# Patient Record
Sex: Female | Born: 1940 | Race: White | Hispanic: No | Smoking: Never smoker
Health system: Southern US, Community
[De-identification: ages and names within clinical notes are randomized; demographics above are authoritative.]

## PROBLEM LIST (undated history)

## (undated) DIAGNOSIS — M810 Age-related osteoporosis without current pathological fracture: Secondary | ICD-10-CM

## (undated) DIAGNOSIS — N200 Calculus of kidney: Secondary | ICD-10-CM

## (undated) DIAGNOSIS — K5790 Diverticulosis of intestine, part unspecified, without perforation or abscess without bleeding: Secondary | ICD-10-CM

## (undated) DIAGNOSIS — K589 Irritable bowel syndrome without diarrhea: Secondary | ICD-10-CM

## (undated) DIAGNOSIS — I10 Essential (primary) hypertension: Secondary | ICD-10-CM

## (undated) DIAGNOSIS — E7800 Pure hypercholesterolemia, unspecified: Secondary | ICD-10-CM

## (undated) HISTORY — DX: Essential (primary) hypertension: I10

## (undated) HISTORY — PX: KIDNEY STONE SURGERY: SHX686

## (undated) HISTORY — PX: LAPAROSCOPIC TUBAL LIGATION: SUR803

## (undated) HISTORY — PX: BREAST BIOPSY: SHX20

---

## 1980-07-17 HISTORY — PX: CHOLECYSTECTOMY: SHX55

## 1998-03-01 ENCOUNTER — Other Ambulatory Visit: Admission: RE | Admit: 1998-03-01 | Discharge: 1998-03-01 | Payer: Self-pay | Admitting: Obstetrics & Gynecology

## 2000-04-19 ENCOUNTER — Encounter: Payer: Self-pay | Admitting: Internal Medicine

## 2000-04-19 ENCOUNTER — Encounter: Admission: RE | Admit: 2000-04-19 | Discharge: 2000-04-19 | Payer: Self-pay | Admitting: Internal Medicine

## 2000-06-19 ENCOUNTER — Encounter (INDEPENDENT_AMBULATORY_CARE_PROVIDER_SITE_OTHER): Payer: Self-pay | Admitting: *Deleted

## 2000-06-19 ENCOUNTER — Ambulatory Visit (HOSPITAL_COMMUNITY): Admission: RE | Admit: 2000-06-19 | Discharge: 2000-06-19 | Payer: Self-pay | Admitting: *Deleted

## 2001-05-24 ENCOUNTER — Encounter: Admission: RE | Admit: 2001-05-24 | Discharge: 2001-05-24 | Payer: Self-pay | Admitting: Internal Medicine

## 2001-05-24 ENCOUNTER — Encounter: Payer: Self-pay | Admitting: Internal Medicine

## 2002-05-30 ENCOUNTER — Encounter: Admission: RE | Admit: 2002-05-30 | Discharge: 2002-05-30 | Payer: Self-pay | Admitting: Internal Medicine

## 2002-05-30 ENCOUNTER — Encounter: Payer: Self-pay | Admitting: Internal Medicine

## 2003-06-03 ENCOUNTER — Encounter: Admission: RE | Admit: 2003-06-03 | Discharge: 2003-06-03 | Payer: Self-pay | Admitting: Internal Medicine

## 2004-06-10 ENCOUNTER — Ambulatory Visit (HOSPITAL_COMMUNITY): Admission: RE | Admit: 2004-06-10 | Discharge: 2004-06-10 | Payer: Self-pay | Admitting: Internal Medicine

## 2004-06-13 ENCOUNTER — Other Ambulatory Visit: Admission: RE | Admit: 2004-06-13 | Discharge: 2004-06-13 | Payer: Self-pay | Admitting: Family

## 2005-06-15 ENCOUNTER — Ambulatory Visit (HOSPITAL_COMMUNITY): Admission: RE | Admit: 2005-06-15 | Discharge: 2005-06-15 | Payer: Self-pay | Admitting: Internal Medicine

## 2005-08-31 ENCOUNTER — Encounter: Admission: RE | Admit: 2005-08-31 | Discharge: 2005-08-31 | Payer: Self-pay | Admitting: Internal Medicine

## 2006-06-22 ENCOUNTER — Other Ambulatory Visit: Admission: RE | Admit: 2006-06-22 | Discharge: 2006-06-22 | Payer: Self-pay | Admitting: Internal Medicine

## 2006-06-22 ENCOUNTER — Ambulatory Visit (HOSPITAL_COMMUNITY): Admission: RE | Admit: 2006-06-22 | Discharge: 2006-06-22 | Payer: Self-pay | Admitting: Internal Medicine

## 2006-07-27 ENCOUNTER — Inpatient Hospital Stay (HOSPITAL_COMMUNITY): Admission: AD | Admit: 2006-07-27 | Discharge: 2006-07-31 | Payer: Self-pay | Admitting: Internal Medicine

## 2006-07-27 ENCOUNTER — Encounter: Admission: RE | Admit: 2006-07-27 | Discharge: 2006-07-27 | Payer: Self-pay | Admitting: Internal Medicine

## 2006-09-10 ENCOUNTER — Encounter: Admission: RE | Admit: 2006-09-10 | Discharge: 2006-09-10 | Payer: Self-pay | Admitting: Gastroenterology

## 2007-08-14 ENCOUNTER — Encounter: Admission: RE | Admit: 2007-08-14 | Discharge: 2007-08-14 | Payer: Self-pay | Admitting: Internal Medicine

## 2008-01-06 ENCOUNTER — Ambulatory Visit (HOSPITAL_COMMUNITY): Admission: RE | Admit: 2008-01-06 | Discharge: 2008-01-06 | Payer: Self-pay | Admitting: Internal Medicine

## 2009-01-08 ENCOUNTER — Ambulatory Visit (HOSPITAL_COMMUNITY): Admission: RE | Admit: 2009-01-08 | Discharge: 2009-01-08 | Payer: Self-pay | Admitting: Family Medicine

## 2010-03-01 ENCOUNTER — Ambulatory Visit (HOSPITAL_COMMUNITY): Admission: RE | Admit: 2010-03-01 | Discharge: 2010-03-01 | Payer: Self-pay | Admitting: Internal Medicine

## 2010-08-07 ENCOUNTER — Encounter: Payer: Self-pay | Admitting: Internal Medicine

## 2010-12-02 NOTE — Discharge Summary (Signed)
[name], [name] NO.:  [id number]   MEDICAL RECORD NO.:  [id number]          PATIENT TYPE:  INP   LOCATION:  6707                         FACILITY:  [facility]   PHYSICIAN:  Thora Lance, M.D.  DATE OF BIRTH:  1940-09-21   DATE OF ADMISSION:  07/27/2006  DATE OF DISCHARGE:  07/31/2006                               DISCHARGE SUMMARY   REASON FOR ADMISSION:  This is a middle-aged female who presented to the  office with the complaint of abdominal pain, fever, body aches, and  constipation.  She had been seen in our office January 9 and diagnosed  with diverticulitis and given Septra and asked to follow up.  Despite  treatment, the patient's symptoms have worsened.  She was seen in our  office and had a white count of 21,000 with an outpatient CT the day of  admission showing diverticulitis of the distal descending and proximal  sigmoid colon with a small amount of free air indicating micro  perforation, no abscess.   SIGNIFICANT FINDINGS:  VITAL SIGNS:  Temperature 99.3.  LUNGS:  Clear.  HEART:  Regular rate and rhythm.  ABDOMEN:  Soft, positive guarding, fullness in the left lower quadrant,  no rebound tenderness.   LABORATORIES:  CBC:  WBC 14.7, hemoglobin 10.9, platelet count 291.  Chemistries:  Sodium 133, potassium 4.0, chloride 101, BUN 8, creatinine  1.0, bicarbonate 26, sugar 95.   HOSPITAL COURSE:  The patient was admitted for acute diverticulitis.  She was placed on Cipro and Flagyl.  Her abdominal pain improved over  the weekend and her white count normalized.  She did develop persistent  nausea over the weekend.  A repeat CAT scan was done that showed no  significant change.  Her Flagyl and Cipro were switched to Zosyn and her  nausea resolved within 24 hours.  She was initially treated with clear  liquids and was switched to a low-residue diet which she tolerated well.  She was discharged in good condition.  It is noted that her iron was low  with an iron level of 10 and a percent saturation of 5% during this  admission.  She will likely need an outpatient colonoscopy and probable  referral to General Surgery thereafter as she has had a history of 3-4  episodes of diverticulitis in the last 10 months.   DISCHARGE DIAGNOSES:  1. Acute diverticulitis.  2. Nausea.  3. Iron-deficiency anemia.  4. Hypertension.  5. Constipation.   DISCHARGE MEDICATIONS:  1. Lisinopril 20 mg a day.  2. Evista 60 mg a day.  3. MiraLax 17 g daily.  4. Multivitamin daily.  5. Augmentin 875 mg p.o. b.i.d., 9 days.  6. Calcium held at discharge.   FOLLOWUP:  In 2 weeks, Dr. Valentina Lucks.  Will arrange GI and surgical  consultations at that time.   DISCHARGE DIET:  Low residue.   ACTIVITY:  As tolerated.  Return to work August 02, 2006.           ______________________________  Thora Lance, M.D.     JJG/MEDQ  D:  08/01/2006  T:  08/02/2006  Job:  [id number]

## 2010-12-02 NOTE — H&P (Signed)
Meghan Hall, Meghan Hall                  ACCOUNT NO.:  [id number]   MEDICAL RECORD NO.:  [id number]          PATIENT TYPE:  INP   LOCATION:  NA                           FACILITY:  [facility]   PHYSICIAN:  Deirdre Peer. Polite, M.D. DATE OF BIRTH:  [birth year]   DATE OF ADMISSION:  DATE OF DISCHARGE:                              HISTORY & PHYSICAL   The patient is being directly admitted via outpatient office.   REASON FOR ADMISSION:  Diverticulitis, failed outpatient treatment.   HISTORY OF PRESENT ILLNESS:  Meghan Hall presents to the office with  complaints of abdominal pain, fever, body aches all over.  The patient  also complains of constipation.  In fact, has not had a bowel movement  since Sunday.  The patient was previously evaluated in the office on  July 25, 2006, by nurse practitioner for exacerbation of  diverticulitis.  At that time, the patient had a temp of 99.4, exam was  consistent with diverticulitis.  The patient was given Septra and asked  to follow up.  Despite the above treatment __________  , this patient  symptoms did not abate, in fact worsened.  The patient presents to the  office today as stated with progressive symptoms of abdominal  discomfort, aching all over, and fever.  In the office today her  temperature is 99.3, BP 100/60.  Labs were ordered outpatient.  The  patient had a white count of 21,000, 85% neutrophils, hemoglobin 12,  hematocrit 38, platelets 329.  The patient had an outpatient CT as well.  CT showed diverticulitis of the distal descending and proximal sigmoid  colon with a small amount of free air indicating micro perforation.  No  abscess.  Because of these findings, high white count, abnormal CT, and  failing outpatient treatment, admission is deemed necessary for further  evaluation and treatment of diverticulitis.   PAST MEDICAL HISTORY:  1. The patient carries a history of hypertension.  2. History of diverticulosis.  3. History of IBS.  4.  History of hyperlipidemia.  5. History of osteoporosis.   CURRENT MEDICATIONS:  1. Lisinopril 20 mg daily.  2. Evista 60 mg a day.  3. Caltrate 600 mg plus D every day.  4. MiraLax 17 grams every day.  5. Multivitamin daily.  6. Dulcolax p.r.n.   SOCIAL HISTORY:  The patient married, has two sons, she is a  Scientist, physiological for a trucking company.  Negative for tobacco.  No alcohol.  No drugs.   ALLERGIES:  NO KNOWN DRUG ALLERGIES.   PAST SURGICAL HISTORY:  Significant for:  1. Cholecystectomy in 1982.  2. Bilateral tubal ligation in the past.  3. Breast biopsy x2 in 1985 and 1995, by Dr. Debbrah Alar,      results negative.  4. Kidney stone removal in June 2000.   REVIEW OF SYSTEMS:  As stated in the HPI.   FAMILY HISTORY:  Father died at age 68 of an MI.  Mother died at 31  breast cancer, hypertension, diabetes, and kidney disease where the  other medical problems she had.  Brother  with hypertension, status post  CABG at age 61.  Sister with hypertension.   PHYSICAL EXAMINATION:  GENERAL:  The patient is alert in mild to  moderate distress secondary to abdominal discomfort.  The patient has  occasional bouts of chills.  VITAL SIGNS:  As stated in the HPI.  HEENT:  Within normal limits.  CHEST:  Clear to auscultation bilaterally.  CARDIOVASCULAR:  Regular.  ABDOMEN:  Soft.  Positive guarding.  The patient does have the sensation  of fullness in the left lower quadrant.  There is no rebound tenderness.  No tympani.  EXTREMITIES:  No clubbing, cyanosis, or edema.  NEUROLOGIC:  Essentially nonfocal.   ASSESSMENT:  1. Diverticulitis which has failed outpatient treatment.  Recommend      admission to the hospital for intravenous antibiotics, intravenous      fluid, and analgesia.  2. Hypertension.  Currently relative hypotension.  We will hold blood      pressure medications currently.  3. Hyperlipidemia.  Resume outpatient medicines when stable.  4. History of  irritable bowel syndrome.  5. Osteoporosis.   We will make further recommendations when deemed necessary.  Thank you  in advance.      Deirdre Peer. Polite, M.D.  Electronically Signed     RDP/MEDQ  D:  07/27/2006  T:  07/27/2006  Job:  [id number]

## 2011-01-31 ENCOUNTER — Other Ambulatory Visit (HOSPITAL_COMMUNITY): Payer: Self-pay | Admitting: Internal Medicine

## 2011-01-31 DIAGNOSIS — Z1231 Encounter for screening mammogram for malignant neoplasm of breast: Secondary | ICD-10-CM

## 2011-03-03 ENCOUNTER — Ambulatory Visit (HOSPITAL_COMMUNITY)
Admission: RE | Admit: 2011-03-03 | Discharge: 2011-03-03 | Disposition: A | Discharge status: Home / Self Care | Payer: Medicare Other | Source: Ambulatory Visit | Attending: Internal Medicine | Admitting: Internal Medicine

## 2011-03-03 DIAGNOSIS — Z1231 Encounter for screening mammogram for malignant neoplasm of breast: Secondary | ICD-10-CM

## 2011-12-19 ENCOUNTER — Other Ambulatory Visit: Payer: Self-pay | Admitting: Internal Medicine

## 2011-12-19 DIAGNOSIS — R1013 Epigastric pain: Secondary | ICD-10-CM

## 2011-12-21 ENCOUNTER — Ambulatory Visit
Admission: RE | Admit: 2011-12-21 | Discharge: 2011-12-21 | Disposition: A | Discharge status: Home / Self Care | Payer: Medicare Other | Source: Ambulatory Visit | Attending: Internal Medicine | Admitting: Internal Medicine

## 2011-12-21 DIAGNOSIS — R1013 Epigastric pain: Secondary | ICD-10-CM

## 2011-12-21 MED ORDER — IOHEXOL 300 MG/ML  SOLN
100.0000 mL | Freq: Once | INTRAMUSCULAR | Status: AC | PRN
  Administered 2011-12-21: 100 mL via INTRAVENOUS

## 2012-01-29 ENCOUNTER — Other Ambulatory Visit (HOSPITAL_COMMUNITY): Payer: Self-pay | Admitting: Internal Medicine

## 2012-01-29 DIAGNOSIS — Z1231 Encounter for screening mammogram for malignant neoplasm of breast: Secondary | ICD-10-CM

## 2012-03-07 ENCOUNTER — Ambulatory Visit (HOSPITAL_COMMUNITY)
Admission: RE | Admit: 2012-03-07 | Discharge: 2012-03-07 | Disposition: A | Discharge status: Home / Self Care | Payer: Medicare Other | Source: Ambulatory Visit | Attending: Internal Medicine | Admitting: Internal Medicine

## 2012-03-07 DIAGNOSIS — Z1231 Encounter for screening mammogram for malignant neoplasm of breast: Secondary | ICD-10-CM

## 2012-12-06 ENCOUNTER — Ambulatory Visit (INDEPENDENT_AMBULATORY_CARE_PROVIDER_SITE_OTHER): Payer: Medicare Other | Admitting: Surgery

## 2012-12-06 ENCOUNTER — Encounter (INDEPENDENT_AMBULATORY_CARE_PROVIDER_SITE_OTHER): Payer: Self-pay | Admitting: Surgery

## 2012-12-06 VITALS — BP 110/68 | HR 66 | Temp 97.7°F | Resp 12 | Ht 60.0 in | Wt 130.6 lb

## 2012-12-06 DIAGNOSIS — R1013 Epigastric pain: Secondary | ICD-10-CM | POA: Insufficient documentation

## 2012-12-06 NOTE — Patient Instructions (Addendum)
Thanks for your patience.  If you need further assistance after leaving the office, please call our office and speak with a [facility] nurse.  [phone].  If you want to leave a message for Dr. Daphine Deutscher, please call his office phone at [phone].  Try to take Ibuprofen for the pain.  Consider heating pad on the front.

## 2012-12-06 NOTE — Progress Notes (Signed)
Chief Complaint:  Constant pain in xiphoid process  History of Present Illness:  Meghan Hall is an 72 y.o. female Referred by Dr. Valentina Lucks with pain in the xiphoid process region. I reviewed a recent CT scan and one from 2009 and it appears that her xiphoid process bifurcates as it goes lower into the abdomen. I do not see any hernia surrounding it. 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 This might be made better by removal of the xiphoid process. Before doing that I would try her on ibuprofen to see if that makes the pain better and also using heating pad. It is interesting that the pain is worse lying supine and seems to go away when lying prone.  Past Medical History  Diagnosis Date  . Hypertension     Past Surgical History  Procedure Laterality Date  . Gallbladder surgery    . Kidney stone surgery      Current Outpatient Prescriptions  Medication Sig Dispense Refill  . atorvastatin (LIPITOR) 10 MG tablet       . lisinopril (PRINIVIL,ZESTRIL) 10 MG tablet       . pantoprazole (PROTONIX) 40 MG tablet        No current facility-administered medications for this visit.   Evista; Ciprocinonide; and Flagyl Family History  Problem Relation Age of Onset  . Heart disease Father    Social History:   reports that she has never smoked. She has never used smokeless tobacco. She reports that she does not drink alcohol or use illicit drugs.   REVIEW OF SYSTEMS - PERTINENT POSITIVES ONLY: Noncontributory  Physical Exam:   Blood pressure 110/68, pulse 66, temperature 97.7 F (36.5 C), temperature source Temporal, resp. rate 12, height 5' (1.524 m), weight 130 lb 9.6 oz (59.24 kg). Body mass index is 25.51 kg/(m^2).  Gen:  WDWN White female NAD  Neurological: Alert and oriented to person, place, and time. Motor and sensory function is grossly intact  Head: Normocephalic and atraumatic.  Eyes: Conjunctivae are normal. Pupils are equal, round,  and reactive to light. No scleral icterus.  Neck: Normal range of motion. Neck supple. No tracheal deviation or thyromegaly present.  Cardiovascular:  SR without murmurs or gallops.  No carotid bruits Respiratory: Effort normal.  No respiratory distress. No chest wall tenderness. Breath sounds normal.  No wheezes, rales or rhonchi.  Abdomen:  Trigger point and xiphoid GU: Musculoskeletal: Normal range of motion. Extremities are nontender. No cyanosis, edema or clubbing noted Lymphadenopathy: No cervical, preauricular, postauricular or axillary adenopathy is present Skin: Skin is warm and dry. No rash noted. No diaphoresis. No erythema. No pallor. Pscyh: Normal mood and affect. Behavior is normal. Judgment and thought content normal.   LABORATORY RESULTS: No results found for this or any previous visit (from the past 48 hour(s)).  RADIOLOGY RESULTS: No results found.  Problem List: There are no active problems to display for this patient.   Assessment & Plan: Probable musculoskeletal pain emanating from the xiphoid process. Plan ibuprofen for pain with observation and will recheck in 4 weeks    Matt B. Daphine Deutscher, MD, [facility] [phone] beeper [phone]  12/06/2012 4:46 PM

## 2012-12-10 ENCOUNTER — Encounter (INDEPENDENT_AMBULATORY_CARE_PROVIDER_SITE_OTHER): Payer: Self-pay

## 2012-12-27 ENCOUNTER — Ambulatory Visit (INDEPENDENT_AMBULATORY_CARE_PROVIDER_SITE_OTHER): Payer: Medicare Other | Admitting: Surgery

## 2013-01-10 ENCOUNTER — Encounter (INDEPENDENT_AMBULATORY_CARE_PROVIDER_SITE_OTHER): Payer: Self-pay | Admitting: Surgery

## 2013-01-10 ENCOUNTER — Ambulatory Visit (INDEPENDENT_AMBULATORY_CARE_PROVIDER_SITE_OTHER): Payer: Medicare Other | Admitting: Surgery

## 2013-01-10 VITALS — BP 126/70 | HR 69 | Temp 96.7°F | Ht 60.0 in | Wt 129.8 lb

## 2013-01-10 DIAGNOSIS — R1013 Epigastric pain: Secondary | ICD-10-CM

## 2013-01-10 NOTE — Progress Notes (Signed)
Scarlette Ar 72 y.o.  Body mass index is 25.35 kg/(m^2).  Patient Active Problem List   Diagnosis Date Noted  . Abdominal pain, epigastric--xiphoid related 12/06/2012    Allergies  Allergen Reactions  . Evista (Raloxifene) Anxiety  . Ciprocinonide (Fluocinolone) Nausea Only  . Flagyl (Metronidazole) Nausea Only    Past Surgical History  Procedure Laterality Date  . Gallbladder surgery    . Kidney stone surgery     Lillia Mountain, MD No diagnosis found.  Pain has resolved with Motrin.   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  Will see back prn. Matt B. Daphine Deutscher, MD, [facility] [phone] beeper [phone]  01/10/2013 3:18 PM

## 2013-01-10 NOTE — Patient Instructions (Signed)
Thanks for your patience.  If you need further assistance after leaving the office, please call our office and speak with a [facility] nurse.  [phone].  If you want to leave a message for Dr. Vernadette Stutsman, please call his office phone at [phone]. 

## 2013-01-30 ENCOUNTER — Other Ambulatory Visit: Payer: Self-pay

## 2013-01-31 ENCOUNTER — Other Ambulatory Visit: Payer: Self-pay | Admitting: Internal Medicine

## 2013-01-31 DIAGNOSIS — N644 Mastodynia: Secondary | ICD-10-CM

## 2013-02-12 ENCOUNTER — Ambulatory Visit
Admission: RE | Admit: 2013-02-12 | Discharge: 2013-02-12 | Disposition: A | Discharge status: Home / Self Care | Payer: Medicare Other | Source: Ambulatory Visit | Attending: Internal Medicine | Admitting: Internal Medicine

## 2013-02-12 DIAGNOSIS — N644 Mastodynia: Secondary | ICD-10-CM

## 2013-03-03 ENCOUNTER — Other Ambulatory Visit: Payer: Self-pay

## 2013-03-03 DIAGNOSIS — Z1231 Encounter for screening mammogram for malignant neoplasm of breast: Secondary | ICD-10-CM

## 2013-03-14 ENCOUNTER — Ambulatory Visit
Admission: RE | Admit: 2013-03-14 | Discharge: 2013-03-14 | Disposition: A | Discharge status: Home / Self Care | Payer: Medicare Other | Source: Ambulatory Visit

## 2013-03-14 DIAGNOSIS — Z1231 Encounter for screening mammogram for malignant neoplasm of breast: Secondary | ICD-10-CM

## 2013-08-28 ENCOUNTER — Other Ambulatory Visit: Payer: Self-pay | Admitting: Internal Medicine

## 2013-08-28 ENCOUNTER — Ambulatory Visit
Admission: RE | Admit: 2013-08-28 | Discharge: 2013-08-28 | Disposition: A | Discharge status: Home / Self Care | Payer: PRIVATE HEALTH INSURANCE | Source: Ambulatory Visit | Attending: Internal Medicine | Admitting: Internal Medicine

## 2013-08-28 DIAGNOSIS — M25569 Pain in unspecified knee: Secondary | ICD-10-CM

## 2014-01-08 ENCOUNTER — Other Ambulatory Visit (HOSPITAL_COMMUNITY): Payer: Self-pay | Admitting: Internal Medicine

## 2014-01-08 DIAGNOSIS — Z1231 Encounter for screening mammogram for malignant neoplasm of breast: Secondary | ICD-10-CM

## 2014-02-12 ENCOUNTER — Ambulatory Visit (HOSPITAL_COMMUNITY): Payer: PRIVATE HEALTH INSURANCE

## 2014-03-16 ENCOUNTER — Ambulatory Visit (HOSPITAL_COMMUNITY)
Admission: RE | Admit: 2014-03-16 | Discharge: 2014-03-16 | Disposition: A | Discharge status: Home / Self Care | Payer: Medicare HMO | Source: Ambulatory Visit | Attending: Internal Medicine | Admitting: Internal Medicine

## 2014-03-16 DIAGNOSIS — Z1231 Encounter for screening mammogram for malignant neoplasm of breast: Secondary | ICD-10-CM | POA: Diagnosis present

## 2014-07-14 ENCOUNTER — Other Ambulatory Visit: Payer: Self-pay | Admitting: Internal Medicine

## 2014-07-14 DIAGNOSIS — M542 Cervicalgia: Secondary | ICD-10-CM

## 2014-07-15 ENCOUNTER — Ambulatory Visit
Admission: RE | Admit: 2014-07-15 | Discharge: 2014-07-15 | Disposition: A | Discharge status: Home / Self Care | Payer: Commercial Managed Care - HMO | Source: Ambulatory Visit | Attending: Internal Medicine | Admitting: Internal Medicine

## 2014-07-15 DIAGNOSIS — M542 Cervicalgia: Secondary | ICD-10-CM

## 2015-03-02 ENCOUNTER — Other Ambulatory Visit (HOSPITAL_COMMUNITY): Payer: Self-pay | Admitting: Internal Medicine

## 2015-03-02 DIAGNOSIS — Z1231 Encounter for screening mammogram for malignant neoplasm of breast: Secondary | ICD-10-CM

## 2015-03-18 ENCOUNTER — Ambulatory Visit (HOSPITAL_COMMUNITY)
Admission: RE | Admit: 2015-03-18 | Discharge: 2015-03-18 | Disposition: A | Discharge status: Home / Self Care | Payer: Medicare Other | Source: Ambulatory Visit | Attending: Internal Medicine | Admitting: Internal Medicine

## 2015-03-18 DIAGNOSIS — Z1231 Encounter for screening mammogram for malignant neoplasm of breast: Secondary | ICD-10-CM

## 2015-03-23 ENCOUNTER — Other Ambulatory Visit: Payer: Self-pay | Admitting: Internal Medicine

## 2015-03-23 DIAGNOSIS — N644 Mastodynia: Secondary | ICD-10-CM

## 2015-04-05 ENCOUNTER — Ambulatory Visit
Admission: RE | Admit: 2015-04-05 | Discharge: 2015-04-05 | Disposition: A | Discharge status: Home / Self Care | Payer: Medicare Other | Source: Ambulatory Visit | Attending: Internal Medicine | Admitting: Internal Medicine

## 2015-04-05 DIAGNOSIS — N644 Mastodynia: Secondary | ICD-10-CM

## 2015-09-21 ENCOUNTER — Other Ambulatory Visit: Payer: Self-pay | Admitting: Physician Assistant

## 2015-09-21 ENCOUNTER — Encounter (HOSPITAL_COMMUNITY): Payer: Self-pay | Admitting: Emergency Medicine

## 2015-09-21 ENCOUNTER — Emergency Department (HOSPITAL_COMMUNITY): Payer: Medicare Other

## 2015-09-21 ENCOUNTER — Emergency Department (HOSPITAL_COMMUNITY)
Admission: EM | Admit: 2015-09-21 | Discharge: 2015-09-21 | Disposition: A | Discharge status: Home / Self Care | Payer: Medicare Other | Attending: Emergency Medicine | Admitting: Emergency Medicine

## 2015-09-21 DIAGNOSIS — Z87442 Personal history of urinary calculi: Secondary | ICD-10-CM | POA: Diagnosis not present

## 2015-09-21 DIAGNOSIS — R0602 Shortness of breath: Secondary | ICD-10-CM | POA: Insufficient documentation

## 2015-09-21 DIAGNOSIS — R079 Chest pain, unspecified: Secondary | ICD-10-CM | POA: Diagnosis present

## 2015-09-21 DIAGNOSIS — R0789 Other chest pain: Secondary | ICD-10-CM | POA: Diagnosis not present

## 2015-09-21 DIAGNOSIS — Z79899 Other long term (current) drug therapy: Secondary | ICD-10-CM | POA: Insufficient documentation

## 2015-09-21 DIAGNOSIS — Z8719 Personal history of other diseases of the digestive system: Secondary | ICD-10-CM | POA: Insufficient documentation

## 2015-09-21 DIAGNOSIS — E7800 Pure hypercholesterolemia, unspecified: Secondary | ICD-10-CM | POA: Diagnosis not present

## 2015-09-21 DIAGNOSIS — I10 Essential (primary) hypertension: Secondary | ICD-10-CM | POA: Diagnosis not present

## 2015-09-21 DIAGNOSIS — R110 Nausea: Secondary | ICD-10-CM | POA: Insufficient documentation

## 2015-09-21 DIAGNOSIS — Z8739 Personal history of other diseases of the musculoskeletal system and connective tissue: Secondary | ICD-10-CM | POA: Insufficient documentation

## 2015-09-21 HISTORY — DX: Pure hypercholesterolemia, unspecified: E78.00

## 2015-09-21 HISTORY — DX: Age-related osteoporosis without current pathological fracture: M81.0

## 2015-09-21 HISTORY — DX: Calculus of kidney: N20.0

## 2015-09-21 HISTORY — DX: Diverticulosis of intestine, part unspecified, without perforation or abscess without bleeding: K57.90

## 2015-09-21 HISTORY — DX: Irritable bowel syndrome, unspecified: K58.9

## 2015-09-21 LAB — BASIC METABOLIC PANEL
Anion gap: 11 (ref 5–15)
BUN: 17 mg/dL (ref 6–20)
CALCIUM: 9.3 mg/dL (ref 8.9–10.3)
CO2: 23 mmol/L (ref 22–32)
Chloride: 105 mmol/L (ref 101–111)
Creatinine, Ser: 0.82 mg/dL (ref 0.44–1.00)
GFR calc Af Amer: >60 mL/min (ref >60)
GLUCOSE: 96 mg/dL (ref 65–99)
Potassium: 4.2 mmol/L (ref 3.5–5.1)
SODIUM: 139 mmol/L (ref 135–145)

## 2015-09-21 LAB — HEPATIC FUNCTION PANEL
ALT: 15 U/L (ref 14–54)
AST: 21 U/L (ref 15–41)
Albumin: 3.6 g/dL (ref 3.5–5.0)
Alkaline Phosphatase: 86 U/L (ref 38–126)
BILIRUBIN DIRECT: 0.1 mg/dL (ref 0.1–0.5)
BILIRUBIN INDIRECT: 0.4 mg/dL (ref 0.3–0.9)
TOTAL PROTEIN: 6.1 g/dL — AB (ref 6.5–8.1)
Total Bilirubin: 0.5 mg/dL (ref 0.3–1.2)

## 2015-09-21 LAB — CBC
HCT: 40.4 % (ref 36.0–46.0)
Hemoglobin: 13.1 g/dL (ref 12.0–15.0)
MCH: 29 pg (ref 26.0–34.0)
MCHC: 32.4 g/dL (ref 30.0–36.0)
MCV: 89.4 fL (ref 78.0–100.0)
PLATELETS: 248 10*3/uL (ref 150–400)
RBC: 4.52 MIL/uL (ref 3.87–5.11)
RDW: 12.5 % (ref 11.5–15.5)
WBC: 10.6 10*3/uL — ABNORMAL HIGH (ref 4.0–10.5)

## 2015-09-21 LAB — LIPASE, BLOOD: LIPASE: 154 U/L — AB (ref 11–51)

## 2015-09-21 LAB — I-STAT TROPONIN, ED: TROPONIN I, POC: 0 ng/mL (ref 0.00–0.08)

## 2015-09-21 LAB — TROPONIN I: Troponin I: <0.03 ng/mL (ref <0.031)

## 2015-09-21 LAB — D-DIMER, QUANTITATIVE (NOT AT ARMC): D DIMER QUANT: 1.05 ug{FEU}/mL — AB (ref 0.00–0.50)

## 2015-09-21 MED ORDER — IOHEXOL 350 MG/ML SOLN
100.0000 mL | Freq: Once | INTRAVENOUS | Status: AC | PRN
  Administered 2015-09-21: 100 mL via INTRAVENOUS

## 2015-09-21 MED ORDER — ONDANSETRON 4 MG PO TBDP
ORAL_TABLET | ORAL | Status: AC
  Filled 2015-09-21: qty 1

## 2015-09-21 MED ORDER — ONDANSETRON 4 MG PO TBDP
4.0000 mg | ORAL_TABLET | Freq: Once | ORAL | Status: AC | PRN
  Administered 2015-09-21: 4 mg via ORAL

## 2015-09-21 NOTE — ED Provider Notes (Signed)
CSN: [id number]     Arrival date & time 09/21/15  1103 History   First MD Initiated Contact with Patient 09/21/15 1353     Chief Complaint  Patient presents with  . Chest Pain     (Consider location/radiation/quality/duration/timing/severity/associated sxs/prior Treatment) HPI Comments: Patient with history of hypertension and high cholesterol presenting with episode of upper back pain that woke her from sleep 2 nights ago. She states this pain radiated from the top of her back "all the way to my waist". It also wraps around to the front of her chest. Pain lasted for several hours but waxing and waning lasting a few minutes at a time. She felt better yesterday. This morning she woke up with pain in her chest that lasted from 1:30 AM until about 10 AM coming and going. States it lasted about 10 minutes at a time. Associated with some shortness of breath and nausea. No diaphoresis or vomiting. Patient denies any cardiac history. She's never had a stress test. She is nonsmoker. She has chronic constipation and was started on new medication for that amitiza.  she is currently pain-free. She states she feels back to baseline. There is no further episodes of chest pain or upper back pain.  The history is provided by the patient.    Past Medical History  Diagnosis Date  . Hypertension   . High cholesterol   . Diverticulosis   . IBS (irritable bowel syndrome)   . Osteoporosis   . Nephrolithiasis    Past Surgical History  Procedure Laterality Date  . Cholecystectomy  1982  . Kidney stone surgery      Removal  . Breast biopsy  1985, 1995  . Laparoscopic tubal ligation     Family History  Problem Relation Age of Onset  . Heart attack Father   . Coronary artery disease Brother 22    CABG  . Hypertension Mother   . Hyperlipidemia Mother   . Kidney disease Mother   . Hypertension Sister    Social History  Substance Use Topics  . Smoking status: Never Smoker   . Smokeless tobacco: Never  Used  . Alcohol Use: No   OB History    No data available     Review of Systems  Constitutional: Negative for fever, activity change and appetite change.  HENT: Negative for congestion, postnasal drip and voice change.   Respiratory: Positive for chest tightness and shortness of breath.   Cardiovascular: Positive for chest pain.  Gastrointestinal: Negative for nausea, vomiting and abdominal pain.  Genitourinary: Negative for dysuria, hematuria, vaginal bleeding and vaginal discharge.  Musculoskeletal: Negative for myalgias and arthralgias.  Skin: Negative for rash.  Neurological: Negative for dizziness, weakness, light-headedness and headaches.   A complete 10 system review of systems was obtained and all systems are negative except as noted in the HPI and PMH.     Allergies  Evista; Ciprocinonide; and Flagyl  Home Medications   Prior to Admission medications   Medication Sig Start Date End Date Taking? Authorizing Provider  atorvastatin (LIPITOR) 10 MG tablet Take 10 mg by mouth daily.  11/20/12  Yes Historical Provider, MD  lisinopril (PRINIVIL,ZESTRIL) 5 MG tablet Take 5 mg by mouth daily. 09/03/15  Yes Historical Provider, MD   BP 112/53 mmHg  Pulse 85  Temp(Src) 97.9 F (36.6 C) (Oral)  Resp 14  Ht  (1.626 m)  Wt 135 lb (61.236 kg)  BMI 23.16 kg/m2  SpO2 100% Physical Exam  Constitutional: She  is oriented to person, place, and time. She appears well-developed and well-nourished. No distress.  HENT:  Head: Normocephalic and atraumatic.  Mouth/Throat: Oropharynx is clear and moist. No oropharyngeal exudate.  Eyes: Conjunctivae and EOM are normal. Pupils are equal, round, and reactive to light.  Neck: Normal range of motion. Neck supple.  No meningismus.  Cardiovascular: Normal rate, regular rhythm, normal heart sounds and intact distal pulses.   No murmur heard. Equal radial pulses and grip strengths.  Pulmonary/Chest: Effort normal and breath sounds normal.  No respiratory distress. She exhibits no tenderness.  Abdominal: Soft. There is no tenderness. There is no rebound and no guarding.  Musculoskeletal: Normal range of motion. She exhibits no edema or tenderness.  Neurological: She is alert and oriented to person, place, and time. No cranial nerve deficit. She exhibits normal muscle tone. Coordination normal.  No ataxia on finger to nose bilaterally. No pronator drift. 5/5 strength throughout. CN 2-12 intact.Equal grip strength. Sensation intact.   Skin: Skin is warm.  Psychiatric: She has a normal mood and affect. Her behavior is normal.  Nursing note and vitals reviewed.   ED Course  Procedures (including critical care time) Labs Review Labs Reviewed  CBC - Abnormal; Notable for the following:    WBC 10.6 (*)    All other components within normal limits  HEPATIC FUNCTION PANEL - Abnormal; Notable for the following:    Total Protein 6.1 (*)    All other components within normal limits  LIPASE, BLOOD - Abnormal; Notable for the following:    Lipase 154 (*)    All other components within normal limits  D-DIMER, QUANTITATIVE (NOT AT [facility]) - Abnormal; Notable for the following:    D-Dimer, Quant 1.05 (*)    All other components within normal limits  BASIC METABOLIC PANEL  TROPONIN I  Rosezena Sensor, ED    Imaging Review Dg Chest 2 View  09/21/2015  CLINICAL DATA:  Chest pain EXAM: CHEST  2 VIEW COMPARISON:  None. FINDINGS: Subsegmental atelectasis at the lateral left base. Normal heart size. Right lung is clear. Left upper lung is clear. No pneumothorax. No pleural effusion. IMPRESSION: Minimal left basilar atelectasis. Electronically Signed   By: Jolaine Click M.D.   On: 09/21/2015 12:05   I have personally reviewed and evaluated these images and lab results as part of my medical decision-making.   EKG Interpretation   Date/Time:  Tuesday September 21 2015 14:07:53 EST Ventricular Rate:  79 PR Interval:  128 QRS Duration: 88 QT  Interval:  375 QTC Calculation: 430 R Axis:   -12 Text Interpretation:  Sinus rhythm Low voltage, precordial leads Abnormal  R-wave progression, early transition Nonspecific T wave abnormality \\E \  Confirmed by Manus Gunning  MD, Lizbett Garciagarcia [id number]) on 09/21/2015 2:52:31 PM      MDM   Final diagnoses:  None  episode of back and chest pain 2 days ago, 2nd episode of chest pain today, now resolved.   EKG with nonspecific T wave inversions.  Pain free now.  CXR normal.  Troponin normal.  ACS considered but pain atypical.  Doubt PE, doubt aortic dissection.  Reassuring that enzyme is negative in setting of >10 hours of pain today. However heart score 4.  Patient does not want to be admitted.  D/w cardiology Dr. Jens Som who feels that pain is unlikely cardiac and agrees with outpatient stress test.   Second troponin and D-dimer pending at time of sign out to Dr. Moody Bruins.    Glynn Octave,  MD 09/21/15 [id number]

## 2015-09-21 NOTE — ED Notes (Signed)
Pt states "I woke up last week with my back and chest hurting so bad." Pt states it hurt since Sunday but got better, but then she woke up this morning and it started hurting again. Pain starts in the back and goes all the way across chest. Pt c/o Nausea, pt also states "Its difficult to breathe" Pt ambulatory in room, Resp e/u. VSS.

## 2015-09-21 NOTE — Discharge Instructions (Signed)
Nonspecific Chest Pain  °Chest pain can be caused by many different conditions. There is always a chance that your pain could be related to something serious, such as a heart attack or a blood clot in your lungs. Chest pain can also be caused by conditions that are not life-threatening. If you have chest pain, it is very important to follow up with your health care provider. °CAUSES  °Chest pain can be caused by: °· Heartburn. °· Pneumonia or bronchitis. °· Anxiety or stress. °· Inflammation around your heart (pericarditis) or lung (pleuritis or pleurisy). °· A blood clot in your lung. °· A collapsed lung (pneumothorax). It can develop suddenly on its own (spontaneous pneumothorax) or from trauma to the chest. °· Shingles infection (varicella-zoster virus). °· Heart attack. °· Damage to the bones, muscles, and cartilage that make up your chest wall. This can include: °¨ Bruised bones due to injury. °¨ Strained muscles or cartilage due to frequent or repeated coughing or overwork. °¨ Fracture to one or more ribs. °¨ Sore cartilage due to inflammation (costochondritis). °RISK FACTORS  °Risk factors for chest pain may include: °· Activities that increase your risk for trauma or injury to your chest. °· Respiratory infections or conditions that cause frequent coughing. °· Medical conditions or overeating that can cause heartburn. °· Heart disease or family history of heart disease. °· Conditions or health behaviors that increase your risk of developing a blood clot. °· Having had chicken pox (varicella zoster). °SIGNS AND SYMPTOMS °Chest pain can feel like: °· Burning or tingling on the surface of your chest or deep in your chest. °· Crushing, pressure, aching, or squeezing pain. °· Dull or sharp pain that is worse when you move, cough, or take a deep breath. °· Pain that is also felt in your back, neck, shoulder, or arm, or pain that spreads to any of these areas. °Your chest pain may come and go, or it may stay  constant. °DIAGNOSIS °Lab tests or other studies may be needed to find the cause of your pain. Your health care provider may have you take a test called an ambulatory ECG (electrocardiogram). An ECG records your heartbeat patterns at the time the test is performed. You may also have other tests, such as: °· Transthoracic echocardiogram (TTE). During echocardiography, sound waves are used to create a picture of all of the heart structures and to look at how blood flows through your heart. °· Transesophageal echocardiogram (TEE). This is a more advanced imaging test that obtains images from inside your body. It allows your health care provider to see your heart in finer detail. °· Cardiac monitoring. This allows your health care provider to monitor your heart rate and rhythm in real time. °· Holter monitor. This is a portable device that records your heartbeat and can help to diagnose abnormal heartbeats. It allows your health care provider to track your heart activity for several days, if needed. °· Stress tests. These can be done through exercise or by taking medicine that makes your heart beat more quickly. °· Blood tests. °· Imaging tests. °TREATMENT  °Your treatment depends on what is causing your chest pain. Treatment may include: °· Medicines. These may include: °¨ Acid blockers for heartburn. °¨ Anti-inflammatory medicine. °¨ Pain medicine for inflammatory conditions. °¨ Antibiotic medicine, if an infection is present. °¨ Medicines to dissolve blood clots. °¨ Medicines to treat coronary artery disease. °· Supportive care for conditions that do not require medicines. This may include: °¨ Resting. °¨ Applying heat   or cold packs to injured areas. °¨ Limiting activities until pain decreases. °HOME CARE INSTRUCTIONS °· If you were prescribed an antibiotic medicine, finish it all even if you start to feel better. °· Avoid any activities that bring on chest pain. °· Do not use any tobacco products, including  cigarettes, chewing tobacco, or electronic cigarettes. If you need help quitting, ask your health care provider. °· Do not drink alcohol. °· Take medicines only as directed by your health care provider. °· Keep all follow-up visits as directed by your health care provider. This is important. This includes any further testing if your chest pain does not go away. °· If heartburn is the cause for your chest pain, you may be told to keep your head raised (elevated) while sleeping. This reduces the chance that acid will go from your stomach into your esophagus. °· Make lifestyle changes as directed by your health care provider. These may include: °¨ Getting regular exercise. Ask your health care provider to suggest some activities that are safe for you. °¨ Eating a heart-healthy diet. A registered dietitian can help you to learn healthy eating options. °¨ Maintaining a healthy weight. °¨ Managing diabetes, if necessary. °¨ Reducing stress. °SEEK MEDICAL CARE IF: °· Your chest pain does not go away after treatment. °· You have a rash with blisters on your chest. °· You have a fever. °SEEK IMMEDIATE MEDICAL CARE IF:  °· Your chest pain is worse. °· You have an increasing cough, or you cough up blood. °· You have severe abdominal pain. °· You have severe weakness. °· You faint. °· You have chills. °· You have sudden, unexplained chest discomfort. °· You have sudden, unexplained discomfort in your arms, back, neck, or jaw. °· You have shortness of breath at any time. °· You suddenly start to sweat, or your skin gets clammy. °· You feel nauseous or you vomit. °· You suddenly feel light-headed or dizzy. °· Your heart begins to beat quickly, or it feels like it is skipping beats. °These symptoms may represent a serious problem that is an emergency. Do not wait to see if the symptoms will go away. Get medical help right away. Call your local emergency services (911 in the [place] Do not drive yourself to the hospital. °  °This  information is not intended to replace advice given to you by your health care provider. Make sure you discuss any questions you have with your health care provider. °  °Document Released: 04/12/2005 Document Revised: 07/24/2014 Document Reviewed: 02/06/2014 °Elsevier Interactive Patient Education ©2016 [employer] ° °

## 2015-09-21 NOTE — Consult Note (Signed)
Primary cardiologist: New  HPI: 75 year old female with no prior cardiac history for evaluation of chest pain. Patient typically does not have dyspnea on exertion, orthopnea, PND, pedal edema, palpitations, syncope or exertional chest pain. She awoke Sunday morning at approximately 1 AM with pain from her neck to her waist both in the back and chest area. The pain was not pleuritic, positional or related to food. She had some nausea but no vomiting. She had mild dyspnea but no diaphoresis. The pain lasted for approximately 14 hours and then resolved. She then felt well Sunday evening and all day Monday. She developed "minimal" one on a scale of 1-10 pain in her left upper chest this morning and again had nausea. She presented to the emergency room and is presently asymptomatic. Cardiology asked to evaluate. Patient denies hemoptysis, melena or hematochezia. She does have some diarrhea.   (Not in a hospital admission)  Allergies  Allergen Reactions  . Evista [Raloxifene] Anxiety  . Ciprocinonide [Fluocinolone] Nausea Only  . Flagyl [Metronidazole] Nausea Only    Past Medical History  Diagnosis Date  . Hypertension   . High cholesterol   . Diverticulosis   . IBS (irritable bowel syndrome)   . Osteoporosis   . Nephrolithiasis     Past Surgical History  Procedure Laterality Date  . Cholecystectomy  1982  . Kidney stone surgery      Removal  . Breast biopsy  1985, 1995  . Laparoscopic tubal ligation      Social History   Social History  . Marital Status: Widowed    Spouse Name: N/A  . Number of Children: 2  . Years of Education: N/A   Occupational History  . Retired    Social History Main Topics  . Smoking status: Never Smoker   . Smokeless tobacco: Never Used  . Alcohol Use: No  . Drug Use: No  . Sexual Activity: Not on file   Other Topics Concern  . Not on file   Social History Narrative   Lives in [place], [place]    Family History  Problem Relation Age of  Onset  . Heart attack Father   . Coronary artery disease Brother 32    CABG  . Hypertension Mother   . Hyperlipidemia Mother   . Kidney disease Mother   . Hypertension Sister     ROS:  no fevers or chills, productive cough, hemoptysis, dysphasia, odynophagia, melena, hematochezia, dysuria, hematuria, rash, seizure activity, orthopnea, PND, pedal edema, claudication. Remaining systems are negative.  Physical Exam:   Blood pressure 112/53, pulse 85, temperature 97.9 F (36.6 C), temperature source Oral, resp. rate 14, height '5\' 4"'  (1.626 m), weight 135 lb (61.236 kg), SpO2 100 %.  General:  Well developed/well nourished in NAD Skin warm/dry Patient not depressed No peripheral clubbing Back-normal HEENT-normal/normal eyelids Neck supple/normal carotid upstroke bilaterally; no bruits; no JVD; no thyromegaly chest - CTA/ normal expansion CV - RRR/normal S1 and S2; no murmurs, rubs or gallops;  PMI nondisplaced Abdomen -NT/ND, no HSM, no mass, + bowel sounds, no bruit 2+ femoral pulses, no bruits Ext-no edema, chords, 2+ DP Neuro-grossly nonfocal  ECG NSR, nonspecific ST changes  Results for orders placed or performed during the hospital encounter of 09/21/15 (from the past 48 hour(s))  Basic metabolic panel     Status: None   Collection Time: 09/21/15 11:12 AM  Result Value Ref Range   Sodium 139 135 - 145 mmol/L   Potassium 4.2 3.5 - 5.1  mmol/L   Chloride 105 101 - 111 mmol/L   CO2 23 22 - 32 mmol/L   Glucose, Bld 96 65 - 99 mg/dL   BUN 17 6 - 20 mg/dL   Creatinine, Ser 0.82 0.44 - 1.00 mg/dL   Calcium 9.3 8.9 - 10.3 mg/dL   GFR calc non Af Amer >60 >60 mL/min   GFR calc Af Amer >60 >60 mL/min    Comment: (NOTE) The eGFR has been calculated using the CKD EPI equation. This calculation has not been validated in all clinical situations. eGFR's persistently <60 mL/min signify possible Chronic Kidney Disease.    Anion gap 11 5 - 15  CBC     Status: Abnormal   Collection  Time: 09/21/15 11:12 AM  Result Value Ref Range   WBC 10.6 (H) 4.0 - 10.5 K/uL   RBC 4.52 3.87 - 5.11 MIL/uL   Hemoglobin 13.1 12.0 - 15.0 g/dL   HCT 40.4 36.0 - 46.0 %   MCV 89.4 78.0 - 100.0 fL   MCH 29.0 26.0 - 34.0 pg   MCHC 32.4 30.0 - 36.0 g/dL   RDW 12.5 11.5 - 15.5 %   Platelets 248 150 - 400 K/uL  I-stat troponin, ED (not at [facility], [facility])     Status: None   Collection Time: 09/21/15 11:23 AM  Result Value Ref Range   Troponin i, poc 0.00 0.00 - 0.08 ng/mL   Comment 3            Comment: Due to the release kinetics of cTnI, a negative result within the first hours of the onset of symptoms does not rule out myocardial infarction with certainty. If myocardial infarction is still suspected, repeat the test at appropriate intervals.   D-dimer, quantitative (not at [facility])     Status: Abnormal   Collection Time: 09/21/15  4:05 PM  Result Value Ref Range   D-Dimer, Quant 1.05 (H) 0.00 - 0.50 ug/mL-FEU    Comment: (NOTE) At the manufacturer cut-off of 0.50 ug/mL FEU, this assay has been documented to exclude PE with a sensitivity and negative predictive value of 97 to 99%.  At this time, this assay has not been approved by the FDA to exclude DVT/VTE. Results should be correlated with clinical presentation.     Dg Chest 2 View  09/21/2015  CLINICAL DATA:  Chest pain EXAM: CHEST  2 VIEW COMPARISON:  None. FINDINGS: Subsegmental atelectasis at the lateral left base. Normal heart size. Right lung is clear. Left upper lung is clear. No pneumothorax. No pleural effusion. IMPRESSION: Minimal left basilar atelectasis. Electronically Signed   By: Marybelle Killings M.D.   On: 09/21/2015 12:05    Assessment/Plan 1 chest pain-symptoms are extremely atypical. She complained of pain for 14 hours Sunday starting in the neck area radiating to the waist area in both the back and chest/stomach. She had a milder chest pain earlier today. Her initial troponin is normal which I would expect to be abnormal  if cardiac in etiology as this should remain elevated for 7-10 days if she had infarcted on Sunday. Electrocardiogram is nondiagnostic. Would check second set of enzymes. If negative she could be discharged from a cardiac standpoint with outpatient functional study. Note her d-dimer is elevated in the emergency room has ordered a CTA. Other etiologies should be considered including GI. 2 Hypertension-continue pre-admission blood pressure medications. Blood pressure controlled. 3 hyperlipidemia-continue statin per primary care.  Kirk Ruths MD 09/21/2015, 4:48 PM

## 2015-09-21 NOTE — ED Notes (Signed)
Pt left at this time with all belongings.  

## 2015-09-21 NOTE — ED Provider Notes (Signed)
  Physical Exam  BP 93/59 mmHg  Pulse 71  Temp(Src) 97.9 F (36.6 C) (Oral)  Resp 17  Ht 5\' 4"  (1.626 m)  Wt 61.236 kg  BMI 23.16 kg/m2  SpO2 99%  Physical Exam  ED Course  Procedures  MDM  Patient taken over from Dr. Manus Gunningancour at [phone].  Please refer to his note for full details of event.  Patient with upper back pain to chest, 1 amp 10 AM this morning. Resolved prior to ED evaluation. Plan to follow up on cardiology recommendations, labs.  4:40 p.m.: Cardiology saw and evaluated patient. Do not suspect this pain to be from cardiac cause but recommend second troponin which is pending. OK for outpatient stress test from cardiology perspective.   Troponin negative, BMP unremarkable, CBC unremarkable. D-dimer elevated, lipase elevated.   CT PE, CT abdomen pelvis ordered and resulted.  No evidence of pulmonary embolism. No acute findings in the abdomen.  Patient reevaluated without return of chest pain. She is stable for discharge home with PCP and cardiology follow-up. This was given to patient prior to discharge.  I discussed case with my attending, Dr. Virl AxeGlick  Kaliegh Willadsen, MD 09/21/15 2027  Dione Boozeavid Glick, MD 09/22/15 2248

## 2016-03-24 ENCOUNTER — Other Ambulatory Visit: Payer: Self-pay | Admitting: Nurse Practitioner

## 2016-03-24 ENCOUNTER — Ambulatory Visit
Admission: RE | Admit: 2016-03-24 | Discharge: 2016-03-24 | Disposition: A | Discharge status: Home / Self Care | Payer: Medicare Other | Source: Ambulatory Visit | Attending: Nurse Practitioner | Admitting: Nurse Practitioner

## 2016-03-24 DIAGNOSIS — R52 Pain, unspecified: Secondary | ICD-10-CM

## 2016-10-13 DIAGNOSIS — K581 Irritable bowel syndrome with constipation: Secondary | ICD-10-CM | POA: Diagnosis not present

## 2016-10-13 DIAGNOSIS — E785 Hyperlipidemia, unspecified: Secondary | ICD-10-CM | POA: Diagnosis not present

## 2016-10-13 DIAGNOSIS — D72829 Elevated white blood cell count, unspecified: Secondary | ICD-10-CM | POA: Diagnosis not present

## 2016-10-13 DIAGNOSIS — I10 Essential (primary) hypertension: Secondary | ICD-10-CM | POA: Diagnosis not present

## 2016-10-13 DIAGNOSIS — K5732 Diverticulitis of large intestine without perforation or abscess without bleeding: Secondary | ICD-10-CM | POA: Diagnosis not present

## 2016-10-14 DIAGNOSIS — E785 Hyperlipidemia, unspecified: Secondary | ICD-10-CM | POA: Diagnosis not present

## 2016-10-14 DIAGNOSIS — D72829 Elevated white blood cell count, unspecified: Secondary | ICD-10-CM | POA: Diagnosis not present

## 2016-10-14 DIAGNOSIS — I10 Essential (primary) hypertension: Secondary | ICD-10-CM | POA: Diagnosis not present

## 2016-10-14 DIAGNOSIS — K5732 Diverticulitis of large intestine without perforation or abscess without bleeding: Secondary | ICD-10-CM | POA: Diagnosis not present

## 2017-05-18 ENCOUNTER — Other Ambulatory Visit (HOSPITAL_COMMUNITY): Payer: Self-pay

## 2017-05-21 ENCOUNTER — Ambulatory Visit (HOSPITAL_COMMUNITY)
Admission: RE | Admit: 2017-05-21 | Discharge: 2017-05-21 | Disposition: A | Discharge status: Home / Self Care | Payer: Medicare Other | Source: Ambulatory Visit | Attending: Internal Medicine | Admitting: Internal Medicine

## 2017-05-21 DIAGNOSIS — M810 Age-related osteoporosis without current pathological fracture: Secondary | ICD-10-CM | POA: Insufficient documentation

## 2017-05-21 MED ORDER — ZOLEDRONIC ACID 5 MG/100ML IV SOLN
5.0000 mg | Freq: Once | INTRAVENOUS | Status: AC
  Administered 2017-05-21: 5 mg via INTRAVENOUS

## 2017-05-21 MED ORDER — ZOLEDRONIC ACID 5 MG/100ML IV SOLN
INTRAVENOUS | Status: AC
Start: 2017-05-21 — End: 2017-05-21
  Administered 2017-05-21: 5 mg via INTRAVENOUS
  Filled 2017-05-21: qty 100

## 2017-05-21 NOTE — Discharge Instructions (Signed)

## 2017-07-25 DIAGNOSIS — K5792 Diverticulitis of intestine, part unspecified, without perforation or abscess without bleeding: Secondary | ICD-10-CM | POA: Diagnosis not present

## 2017-07-25 DIAGNOSIS — R1031 Right lower quadrant pain: Secondary | ICD-10-CM | POA: Diagnosis not present

## 2017-07-25 DIAGNOSIS — K5900 Constipation, unspecified: Secondary | ICD-10-CM | POA: Diagnosis not present

## 2017-08-15 DIAGNOSIS — K5900 Constipation, unspecified: Secondary | ICD-10-CM | POA: Diagnosis not present

## 2017-08-15 DIAGNOSIS — K5792 Diverticulitis of intestine, part unspecified, without perforation or abscess without bleeding: Secondary | ICD-10-CM | POA: Diagnosis not present

## 2017-08-15 DIAGNOSIS — Z1211 Encounter for screening for malignant neoplasm of colon: Secondary | ICD-10-CM | POA: Diagnosis not present

## 2017-08-16 DIAGNOSIS — M5431 Sciatica, right side: Secondary | ICD-10-CM | POA: Diagnosis not present

## 2017-09-05 DIAGNOSIS — K6289 Other specified diseases of anus and rectum: Secondary | ICD-10-CM | POA: Diagnosis not present

## 2017-09-05 DIAGNOSIS — K644 Residual hemorrhoidal skin tags: Secondary | ICD-10-CM | POA: Diagnosis not present

## 2017-09-05 DIAGNOSIS — K5730 Diverticulosis of large intestine without perforation or abscess without bleeding: Secondary | ICD-10-CM | POA: Diagnosis not present

## 2017-09-05 DIAGNOSIS — Z1211 Encounter for screening for malignant neoplasm of colon: Secondary | ICD-10-CM | POA: Diagnosis not present

## 2017-09-12 DIAGNOSIS — H5211 Myopia, right eye: Secondary | ICD-10-CM | POA: Diagnosis not present

## 2017-09-13 DIAGNOSIS — M5416 Radiculopathy, lumbar region: Secondary | ICD-10-CM | POA: Diagnosis not present

## 2017-09-19 DIAGNOSIS — M5417 Radiculopathy, lumbosacral region: Secondary | ICD-10-CM | POA: Diagnosis not present

## 2017-09-20 DIAGNOSIS — H2513 Age-related nuclear cataract, bilateral: Secondary | ICD-10-CM | POA: Diagnosis not present

## 2017-09-24 DIAGNOSIS — M5417 Radiculopathy, lumbosacral region: Secondary | ICD-10-CM | POA: Diagnosis not present

## 2017-10-01 DIAGNOSIS — M5417 Radiculopathy, lumbosacral region: Secondary | ICD-10-CM | POA: Diagnosis not present

## 2017-10-04 DIAGNOSIS — Z01818 Encounter for other preprocedural examination: Secondary | ICD-10-CM | POA: Diagnosis not present

## 2017-10-04 DIAGNOSIS — H2511 Age-related nuclear cataract, right eye: Secondary | ICD-10-CM | POA: Diagnosis not present

## 2017-10-08 DIAGNOSIS — H2511 Age-related nuclear cataract, right eye: Secondary | ICD-10-CM | POA: Diagnosis not present

## 2017-10-08 DIAGNOSIS — H25811 Combined forms of age-related cataract, right eye: Secondary | ICD-10-CM | POA: Diagnosis not present

## 2017-10-10 DIAGNOSIS — M5417 Radiculopathy, lumbosacral region: Secondary | ICD-10-CM | POA: Diagnosis not present

## 2017-10-11 DIAGNOSIS — M5417 Radiculopathy, lumbosacral region: Secondary | ICD-10-CM | POA: Diagnosis not present

## 2017-10-15 DIAGNOSIS — M5417 Radiculopathy, lumbosacral region: Secondary | ICD-10-CM | POA: Diagnosis not present

## 2017-10-19 DIAGNOSIS — M5417 Radiculopathy, lumbosacral region: Secondary | ICD-10-CM | POA: Diagnosis not present

## 2017-10-22 DIAGNOSIS — H25812 Combined forms of age-related cataract, left eye: Secondary | ICD-10-CM | POA: Diagnosis not present

## 2017-10-22 DIAGNOSIS — H2512 Age-related nuclear cataract, left eye: Secondary | ICD-10-CM | POA: Diagnosis not present

## 2017-10-26 DIAGNOSIS — H33022 Retinal detachment with multiple breaks, left eye: Secondary | ICD-10-CM | POA: Diagnosis not present

## 2017-10-26 DIAGNOSIS — H33052 Total retinal detachment, left eye: Secondary | ICD-10-CM | POA: Diagnosis not present

## 2017-10-26 DIAGNOSIS — H33312 Horseshoe tear of retina without detachment, left eye: Secondary | ICD-10-CM | POA: Diagnosis not present

## 2017-10-26 DIAGNOSIS — H3322 Serous retinal detachment, left eye: Secondary | ICD-10-CM | POA: Diagnosis not present

## 2017-11-01 DIAGNOSIS — H3322 Serous retinal detachment, left eye: Secondary | ICD-10-CM | POA: Diagnosis not present

## 2017-11-07 DIAGNOSIS — H3322 Serous retinal detachment, left eye: Secondary | ICD-10-CM | POA: Diagnosis not present

## 2017-11-20 DIAGNOSIS — H3322 Serous retinal detachment, left eye: Secondary | ICD-10-CM | POA: Diagnosis not present

## 2017-12-04 DIAGNOSIS — H524 Presbyopia: Secondary | ICD-10-CM | POA: Diagnosis not present

## 2017-12-04 DIAGNOSIS — Z961 Presence of intraocular lens: Secondary | ICD-10-CM | POA: Diagnosis not present

## 2017-12-04 DIAGNOSIS — H52223 Regular astigmatism, bilateral: Secondary | ICD-10-CM | POA: Diagnosis not present

## 2018-01-03 DIAGNOSIS — M79644 Pain in right finger(s): Secondary | ICD-10-CM | POA: Diagnosis not present

## 2018-01-03 DIAGNOSIS — L03011 Cellulitis of right finger: Secondary | ICD-10-CM | POA: Diagnosis not present

## 2018-01-25 DIAGNOSIS — I10 Essential (primary) hypertension: Secondary | ICD-10-CM | POA: Diagnosis not present

## 2018-01-25 DIAGNOSIS — E7800 Pure hypercholesterolemia, unspecified: Secondary | ICD-10-CM | POA: Diagnosis not present

## 2018-01-25 DIAGNOSIS — M5431 Sciatica, right side: Secondary | ICD-10-CM | POA: Diagnosis not present

## 2018-01-25 DIAGNOSIS — M810 Age-related osteoporosis without current pathological fracture: Secondary | ICD-10-CM | POA: Diagnosis not present

## 2018-01-27 DIAGNOSIS — N3090 Cystitis, unspecified without hematuria: Secondary | ICD-10-CM | POA: Diagnosis not present

## 2018-01-27 DIAGNOSIS — E7800 Pure hypercholesterolemia, unspecified: Secondary | ICD-10-CM | POA: Diagnosis not present

## 2018-01-27 DIAGNOSIS — I10 Essential (primary) hypertension: Secondary | ICD-10-CM | POA: Diagnosis not present

## 2018-01-27 DIAGNOSIS — B9689 Other specified bacterial agents as the cause of diseases classified elsewhere: Secondary | ICD-10-CM | POA: Diagnosis not present

## 2018-01-27 DIAGNOSIS — K5790 Diverticulosis of intestine, part unspecified, without perforation or abscess without bleeding: Secondary | ICD-10-CM | POA: Diagnosis not present

## 2018-02-05 DIAGNOSIS — M810 Age-related osteoporosis without current pathological fracture: Secondary | ICD-10-CM | POA: Diagnosis not present

## 2018-02-20 DIAGNOSIS — H35352 Cystoid macular degeneration, left eye: Secondary | ICD-10-CM | POA: Diagnosis not present

## 2018-02-20 DIAGNOSIS — H3322 Serous retinal detachment, left eye: Secondary | ICD-10-CM | POA: Diagnosis not present

## 2018-02-28 DIAGNOSIS — H3322 Serous retinal detachment, left eye: Secondary | ICD-10-CM | POA: Diagnosis not present

## 2018-02-28 DIAGNOSIS — H35352 Cystoid macular degeneration, left eye: Secondary | ICD-10-CM | POA: Diagnosis not present

## 2018-03-21 DIAGNOSIS — Z09 Encounter for follow-up examination after completed treatment for conditions other than malignant neoplasm: Secondary | ICD-10-CM | POA: Diagnosis not present

## 2018-03-21 DIAGNOSIS — H35352 Cystoid macular degeneration, left eye: Secondary | ICD-10-CM | POA: Diagnosis not present

## 2018-04-02 DIAGNOSIS — S6010XA Contusion of unspecified finger with damage to nail, initial encounter: Secondary | ICD-10-CM | POA: Diagnosis not present

## 2018-04-10 ENCOUNTER — Other Ambulatory Visit: Payer: Self-pay | Admitting: Internal Medicine

## 2018-04-10 ENCOUNTER — Ambulatory Visit
Admission: RE | Admit: 2018-04-10 | Discharge: 2018-04-10 | Disposition: A | Discharge status: Home / Self Care | Payer: Medicare HMO | Source: Ambulatory Visit | Attending: Internal Medicine | Admitting: Internal Medicine

## 2018-04-10 DIAGNOSIS — S0990XA Unspecified injury of head, initial encounter: Secondary | ICD-10-CM

## 2018-04-10 DIAGNOSIS — R531 Weakness: Secondary | ICD-10-CM | POA: Diagnosis not present

## 2018-04-10 DIAGNOSIS — Z23 Encounter for immunization: Secondary | ICD-10-CM | POA: Diagnosis not present

## 2018-04-10 DIAGNOSIS — R42 Dizziness and giddiness: Secondary | ICD-10-CM | POA: Diagnosis not present

## 2018-04-25 DIAGNOSIS — H35352 Cystoid macular degeneration, left eye: Secondary | ICD-10-CM | POA: Diagnosis not present

## 2018-05-30 DIAGNOSIS — H43811 Vitreous degeneration, right eye: Secondary | ICD-10-CM | POA: Diagnosis not present

## 2018-05-30 DIAGNOSIS — H35352 Cystoid macular degeneration, left eye: Secondary | ICD-10-CM | POA: Diagnosis not present

## 2018-07-04 DIAGNOSIS — H35352 Cystoid macular degeneration, left eye: Secondary | ICD-10-CM | POA: Diagnosis not present

## 2018-07-04 DIAGNOSIS — H43811 Vitreous degeneration, right eye: Secondary | ICD-10-CM | POA: Diagnosis not present

## 2019-08-01 ENCOUNTER — Other Ambulatory Visit: Payer: Self-pay | Admitting: Internal Medicine

## 2019-08-01 ENCOUNTER — Other Ambulatory Visit: Payer: Self-pay

## 2019-08-01 ENCOUNTER — Ambulatory Visit
Admission: RE | Admit: 2019-08-01 | Discharge: 2019-08-01 | Disposition: A | Discharge status: Home / Self Care | Payer: Medicare Other | Source: Ambulatory Visit | Attending: Internal Medicine | Admitting: Internal Medicine

## 2019-08-01 DIAGNOSIS — R0600 Dyspnea, unspecified: Secondary | ICD-10-CM

## 2019-08-01 DIAGNOSIS — R0609 Other forms of dyspnea: Secondary | ICD-10-CM

## 2020-03-19 ENCOUNTER — Ambulatory Visit
Admission: RE | Admit: 2020-03-19 | Discharge: 2020-03-19 | Disposition: A | Discharge status: Home / Self Care | Payer: Medicare Other | Source: Ambulatory Visit | Attending: Physician Assistant | Admitting: Physician Assistant

## 2020-03-19 ENCOUNTER — Other Ambulatory Visit: Payer: Self-pay | Admitting: Physician Assistant

## 2020-03-19 DIAGNOSIS — M25551 Pain in right hip: Secondary | ICD-10-CM

## 2020-07-22 DIAGNOSIS — H401111 Primary open-angle glaucoma, right eye, mild stage: Secondary | ICD-10-CM | POA: Diagnosis not present

## 2020-07-26 DIAGNOSIS — S76012S Strain of muscle, fascia and tendon of left hip, sequela: Secondary | ICD-10-CM | POA: Diagnosis not present

## 2020-07-26 DIAGNOSIS — M79673 Pain in unspecified foot: Secondary | ICD-10-CM | POA: Diagnosis not present

## 2020-08-09 DIAGNOSIS — B351 Tinea unguium: Secondary | ICD-10-CM | POA: Diagnosis not present

## 2020-08-09 DIAGNOSIS — L601 Onycholysis: Secondary | ICD-10-CM | POA: Diagnosis not present

## 2020-08-16 DIAGNOSIS — M79674 Pain in right toe(s): Secondary | ICD-10-CM | POA: Diagnosis not present

## 2020-08-16 DIAGNOSIS — L603 Nail dystrophy: Secondary | ICD-10-CM | POA: Diagnosis not present

## 2020-09-09 DIAGNOSIS — H401111 Primary open-angle glaucoma, right eye, mild stage: Secondary | ICD-10-CM | POA: Diagnosis not present

## 2020-09-10 ENCOUNTER — Other Ambulatory Visit: Payer: Self-pay | Admitting: Internal Medicine

## 2020-09-10 DIAGNOSIS — K581 Irritable bowel syndrome with constipation: Secondary | ICD-10-CM | POA: Diagnosis not present

## 2020-09-10 DIAGNOSIS — Z0000 Encounter for general adult medical examination without abnormal findings: Secondary | ICD-10-CM | POA: Diagnosis not present

## 2020-09-10 DIAGNOSIS — E7800 Pure hypercholesterolemia, unspecified: Secondary | ICD-10-CM | POA: Diagnosis not present

## 2020-09-10 DIAGNOSIS — M810 Age-related osteoporosis without current pathological fracture: Secondary | ICD-10-CM | POA: Diagnosis not present

## 2020-09-10 DIAGNOSIS — Z1389 Encounter for screening for other disorder: Secondary | ICD-10-CM | POA: Diagnosis not present

## 2020-09-10 DIAGNOSIS — I10 Essential (primary) hypertension: Secondary | ICD-10-CM | POA: Diagnosis not present

## 2020-09-20 IMAGING — CT CT HEAD W/O CM
4 series · 16 of 47 positions shown, 18 images · non-contrast
Comparison: None.

CLINICAL DATA: Right side weakness, dizziness

EXAM:
CT HEAD WITHOUT CONTRAST
TECHNIQUE: Contiguous axial images were obtained from the base of the skull
through the vertex without intravenous contrast.

[Series 2: head 5.00 hr40 s3 ibhc · axial · 0.41mm/px · z∈[-536,-431]mm · 7 of 29 slices shown, 9 images]
[im 4/29  brain]
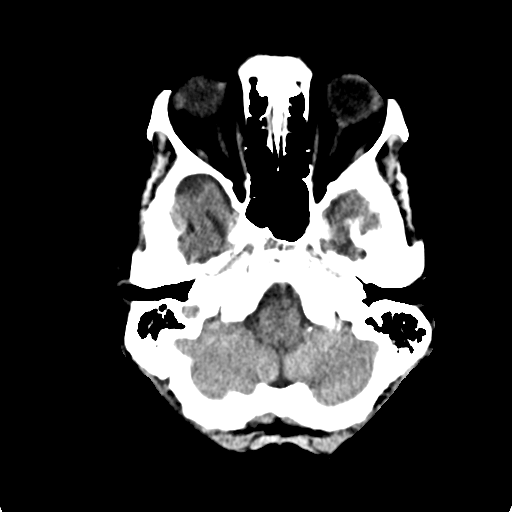
[im 4/29  bone]
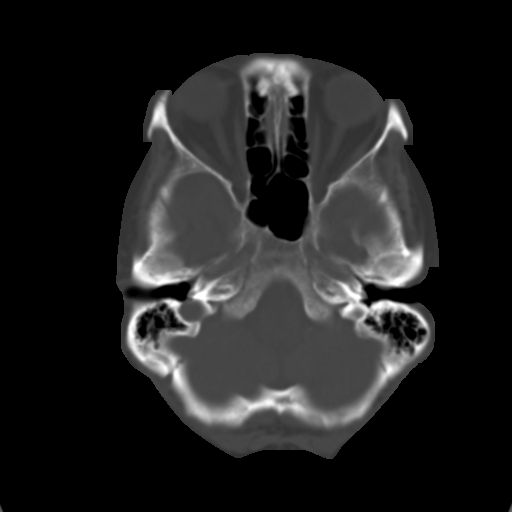
[im 8/29  brain]
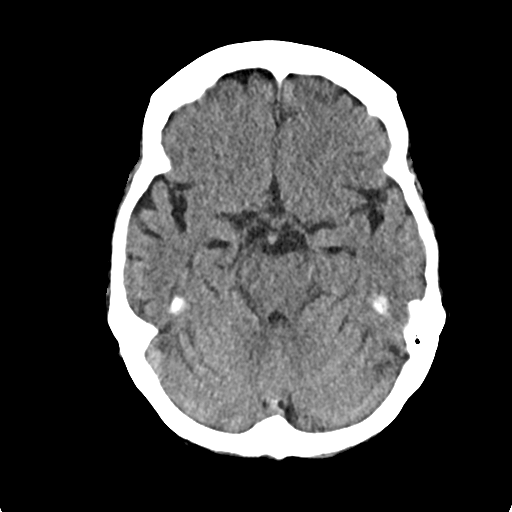
[im 11/29  brain]
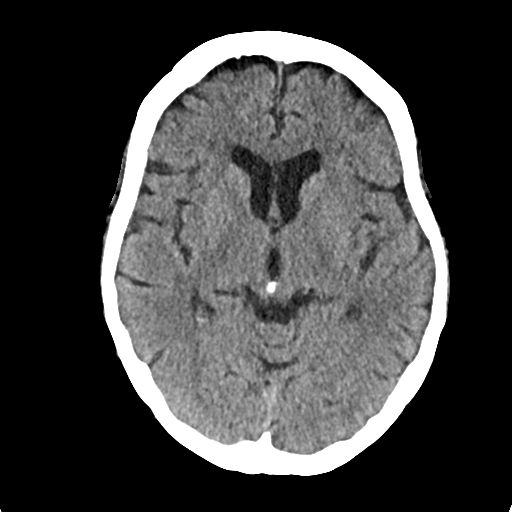
[im 15/29  brain]
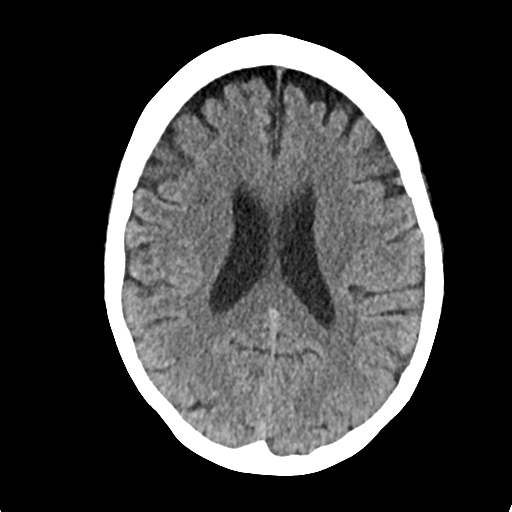
[im 18/29  brain]
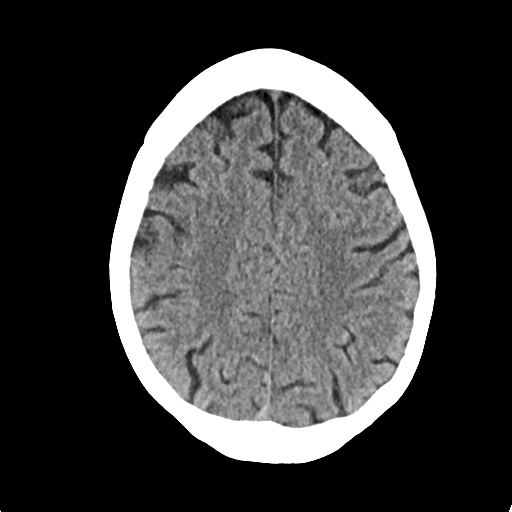
[im 18/29  bone]
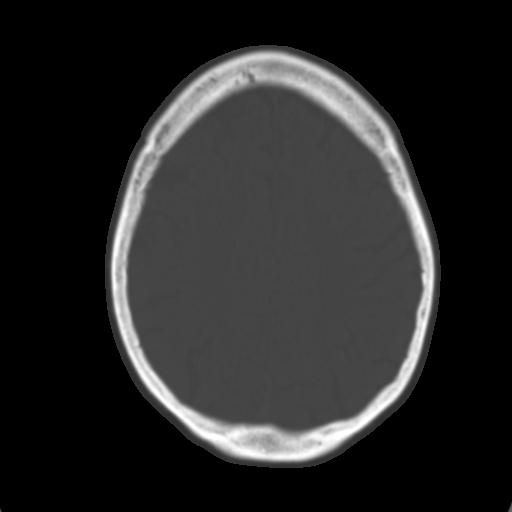
[im 22/29  brain]
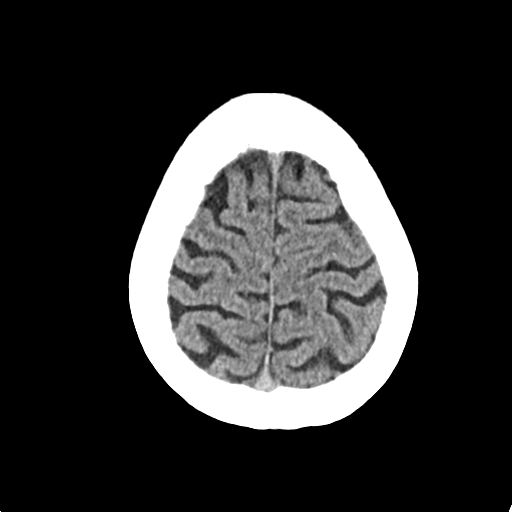
[im 25/29  brain]
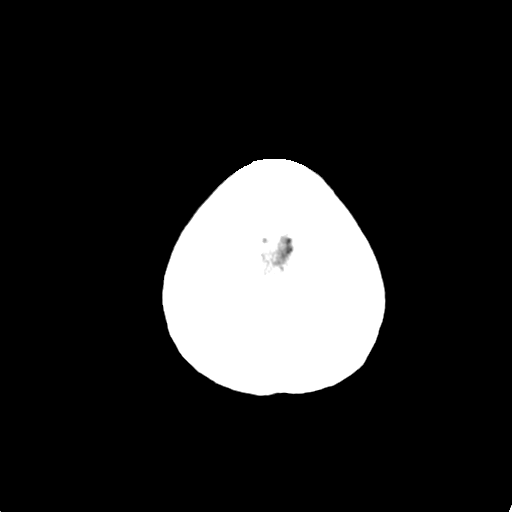

[Series 3: head 2.00 hr60 s3 bone · axial · 0.41mm/px · z∈[-539,-511]mm · 3 of 72 slices shown]
[im 8/72  bone]
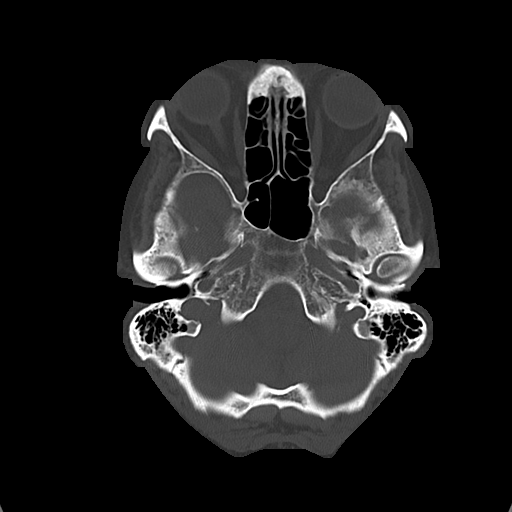
[im 15/72  bone]
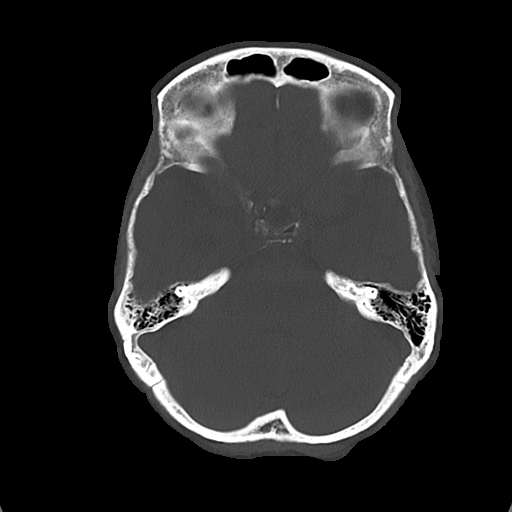
[im 22/72  bone]
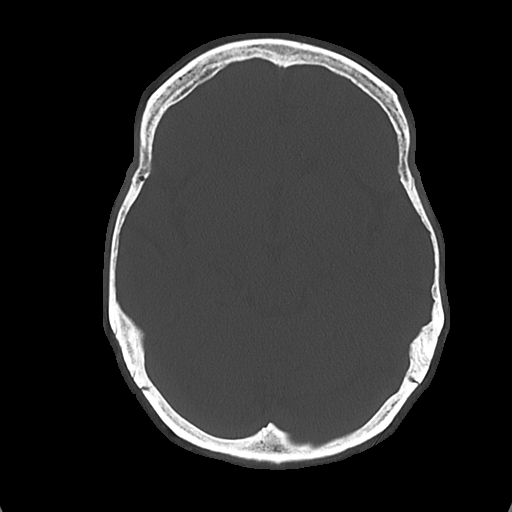

[Series 4: head 3.00 hr40 s3 sag · sagittal · 0.29mm/px · 3 of 85 slices shown]
[im 29/85  brain]
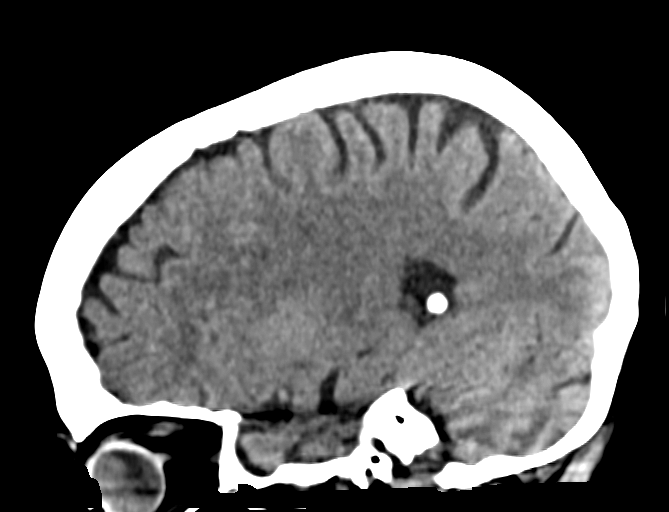
[im 43/85  brain]
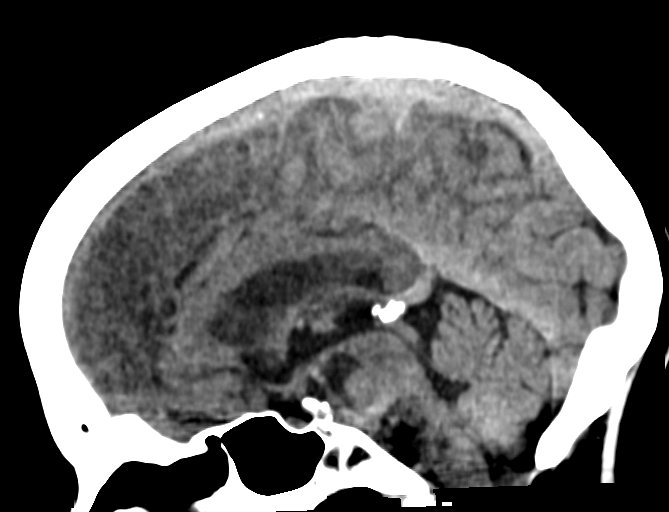
[im 57/85  brain]
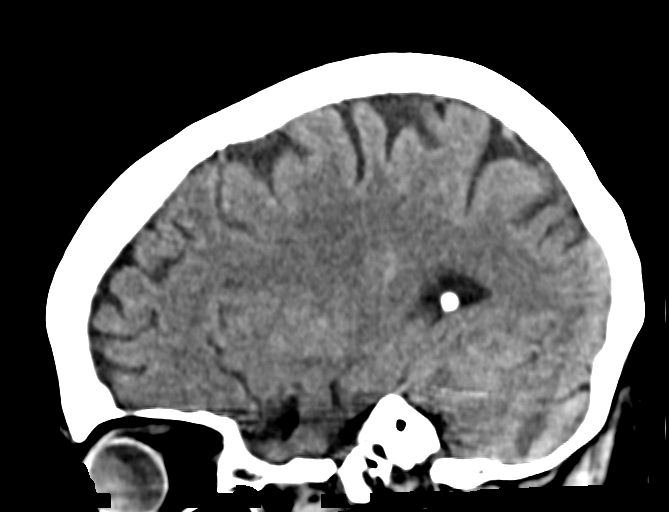

[Series 6: head 3.00 hr40 s3 cor · coronal · 0.28mm/px · 3 of 104 slices shown]
[im 35/104  brain]
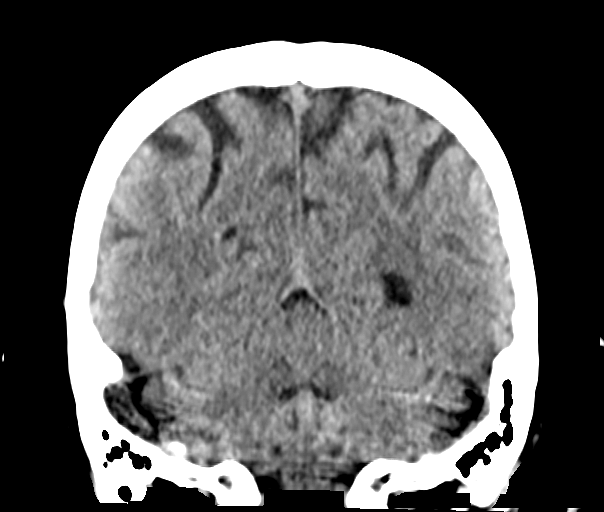
[im 46/104  brain]
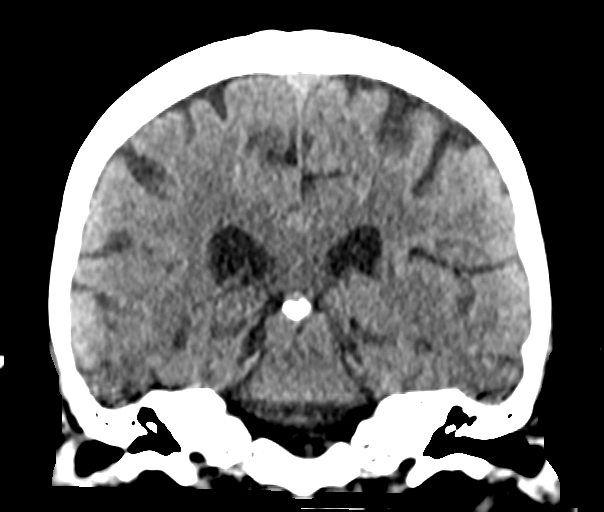
[im 58/104  brain]
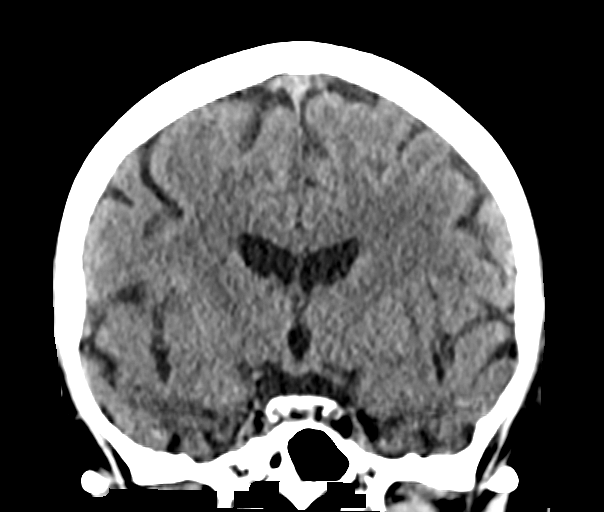

[16 of 47 positions shown; findings below may reference images not displayed]

FINDINGS: Brain: Mild age related volume loss. No acute intracranial
abnormality. Specifically, no hemorrhage, hydrocephalus, mass
lesion, acute infarction, or significant intracranial injury.

Vascular: No hyperdense vessel or unexpected calcification.

Skull: No acute calvarial abnormality.

Sinuses/Orbits: Visualized paranasal sinuses and mastoids clear.
Orbital soft tissues unremarkable.

Other: None
IMPRESSION: No acute intracranial abnormality.

## 2020-10-05 DIAGNOSIS — G8929 Other chronic pain: Secondary | ICD-10-CM | POA: Diagnosis not present

## 2020-10-05 DIAGNOSIS — M1611 Unilateral primary osteoarthritis, right hip: Secondary | ICD-10-CM | POA: Diagnosis not present

## 2020-10-05 DIAGNOSIS — M179 Osteoarthritis of knee, unspecified: Secondary | ICD-10-CM | POA: Diagnosis not present

## 2020-10-05 DIAGNOSIS — I10 Essential (primary) hypertension: Secondary | ICD-10-CM | POA: Diagnosis not present

## 2020-10-05 DIAGNOSIS — M810 Age-related osteoporosis without current pathological fracture: Secondary | ICD-10-CM | POA: Diagnosis not present

## 2020-10-05 DIAGNOSIS — E7800 Pure hypercholesterolemia, unspecified: Secondary | ICD-10-CM | POA: Diagnosis not present

## 2020-10-21 DIAGNOSIS — H401111 Primary open-angle glaucoma, right eye, mild stage: Secondary | ICD-10-CM | POA: Diagnosis not present

## 2020-12-22 DIAGNOSIS — M179 Osteoarthritis of knee, unspecified: Secondary | ICD-10-CM | POA: Diagnosis not present

## 2020-12-22 DIAGNOSIS — G8929 Other chronic pain: Secondary | ICD-10-CM | POA: Diagnosis not present

## 2020-12-22 DIAGNOSIS — M1611 Unilateral primary osteoarthritis, right hip: Secondary | ICD-10-CM | POA: Diagnosis not present

## 2020-12-22 DIAGNOSIS — M810 Age-related osteoporosis without current pathological fracture: Secondary | ICD-10-CM | POA: Diagnosis not present

## 2020-12-22 DIAGNOSIS — I10 Essential (primary) hypertension: Secondary | ICD-10-CM | POA: Diagnosis not present

## 2020-12-22 DIAGNOSIS — E7800 Pure hypercholesterolemia, unspecified: Secondary | ICD-10-CM | POA: Diagnosis not present

## 2021-02-09 DIAGNOSIS — R0600 Dyspnea, unspecified: Secondary | ICD-10-CM | POA: Diagnosis not present

## 2021-02-09 DIAGNOSIS — R509 Fever, unspecified: Secondary | ICD-10-CM | POA: Diagnosis not present

## 2021-02-11 DIAGNOSIS — U071 COVID-19: Secondary | ICD-10-CM | POA: Diagnosis not present

## 2021-02-11 DIAGNOSIS — H5702 Anisocoria: Secondary | ICD-10-CM | POA: Diagnosis not present

## 2021-02-11 DIAGNOSIS — R519 Headache, unspecified: Secondary | ICD-10-CM | POA: Diagnosis not present

## 2021-02-11 DIAGNOSIS — R700 Elevated erythrocyte sedimentation rate: Secondary | ICD-10-CM | POA: Diagnosis not present

## 2021-02-14 DIAGNOSIS — Z961 Presence of intraocular lens: Secondary | ICD-10-CM | POA: Diagnosis not present

## 2021-02-14 DIAGNOSIS — R51 Headache with orthostatic component, not elsewhere classified: Secondary | ICD-10-CM | POA: Diagnosis not present

## 2021-02-21 ENCOUNTER — Ambulatory Visit
Admission: RE | Admit: 2021-02-21 | Discharge: 2021-02-21 | Disposition: A | Discharge status: Home / Self Care | Payer: Medicare Other | Source: Ambulatory Visit | Attending: Internal Medicine | Admitting: Internal Medicine

## 2021-02-21 ENCOUNTER — Other Ambulatory Visit: Payer: Self-pay

## 2021-02-21 DIAGNOSIS — Z780 Asymptomatic menopausal state: Secondary | ICD-10-CM | POA: Diagnosis not present

## 2021-02-21 DIAGNOSIS — M810 Age-related osteoporosis without current pathological fracture: Secondary | ICD-10-CM | POA: Diagnosis not present

## 2021-02-21 DIAGNOSIS — M8588 Other specified disorders of bone density and structure, other site: Secondary | ICD-10-CM | POA: Diagnosis not present

## 2021-03-04 DIAGNOSIS — I10 Essential (primary) hypertension: Secondary | ICD-10-CM | POA: Diagnosis not present

## 2021-03-04 DIAGNOSIS — K5909 Other constipation: Secondary | ICD-10-CM | POA: Diagnosis not present

## 2021-03-04 DIAGNOSIS — M810 Age-related osteoporosis without current pathological fracture: Secondary | ICD-10-CM | POA: Diagnosis not present

## 2021-03-04 DIAGNOSIS — R413 Other amnesia: Secondary | ICD-10-CM | POA: Diagnosis not present

## 2021-03-07 DIAGNOSIS — H43312 Vitreous membranes and strands, left eye: Secondary | ICD-10-CM | POA: Diagnosis not present

## 2021-03-07 DIAGNOSIS — Z961 Presence of intraocular lens: Secondary | ICD-10-CM | POA: Diagnosis not present

## 2021-03-07 DIAGNOSIS — R51 Headache with orthostatic component, not elsewhere classified: Secondary | ICD-10-CM | POA: Diagnosis not present

## 2021-04-26 DIAGNOSIS — M179 Osteoarthritis of knee, unspecified: Secondary | ICD-10-CM | POA: Diagnosis not present

## 2021-04-26 DIAGNOSIS — I10 Essential (primary) hypertension: Secondary | ICD-10-CM | POA: Diagnosis not present

## 2021-04-26 DIAGNOSIS — E7800 Pure hypercholesterolemia, unspecified: Secondary | ICD-10-CM | POA: Diagnosis not present

## 2021-04-26 DIAGNOSIS — M1611 Unilateral primary osteoarthritis, right hip: Secondary | ICD-10-CM | POA: Diagnosis not present

## 2021-04-26 DIAGNOSIS — G8929 Other chronic pain: Secondary | ICD-10-CM | POA: Diagnosis not present

## 2021-04-26 DIAGNOSIS — M810 Age-related osteoporosis without current pathological fracture: Secondary | ICD-10-CM | POA: Diagnosis not present

## 2021-05-30 DIAGNOSIS — K921 Melena: Secondary | ICD-10-CM | POA: Diagnosis not present

## 2021-06-07 DIAGNOSIS — K921 Melena: Secondary | ICD-10-CM | POA: Diagnosis not present

## 2021-06-07 DIAGNOSIS — E875 Hyperkalemia: Secondary | ICD-10-CM | POA: Diagnosis not present

## 2021-07-13 DIAGNOSIS — H43312 Vitreous membranes and strands, left eye: Secondary | ICD-10-CM | POA: Diagnosis not present

## 2021-08-07 DIAGNOSIS — I773 Arterial fibromuscular dysplasia: Secondary | ICD-10-CM | POA: Diagnosis not present

## 2021-08-07 DIAGNOSIS — H9209 Otalgia, unspecified ear: Secondary | ICD-10-CM | POA: Diagnosis not present

## 2021-08-07 DIAGNOSIS — M542 Cervicalgia: Secondary | ICD-10-CM | POA: Diagnosis not present

## 2021-08-07 DIAGNOSIS — I6523 Occlusion and stenosis of bilateral carotid arteries: Secondary | ICD-10-CM | POA: Diagnosis not present

## 2021-08-07 DIAGNOSIS — Z20822 Contact with and (suspected) exposure to covid-19: Secondary | ICD-10-CM | POA: Diagnosis not present

## 2021-08-07 DIAGNOSIS — R079 Chest pain, unspecified: Secondary | ICD-10-CM | POA: Diagnosis not present

## 2021-08-07 DIAGNOSIS — R519 Headache, unspecified: Secondary | ICD-10-CM | POA: Diagnosis not present

## 2021-08-07 DIAGNOSIS — M436 Torticollis: Secondary | ICD-10-CM | POA: Diagnosis not present

## 2021-08-07 DIAGNOSIS — H5710 Ocular pain, unspecified eye: Secondary | ICD-10-CM | POA: Diagnosis not present

## 2021-08-08 DIAGNOSIS — R519 Headache, unspecified: Secondary | ICD-10-CM | POA: Diagnosis not present

## 2021-08-08 DIAGNOSIS — D72829 Elevated white blood cell count, unspecified: Secondary | ICD-10-CM | POA: Diagnosis not present

## 2021-08-12 ENCOUNTER — Other Ambulatory Visit: Payer: Self-pay | Admitting: Internal Medicine

## 2021-08-12 DIAGNOSIS — R519 Headache, unspecified: Secondary | ICD-10-CM

## 2021-08-15 DIAGNOSIS — D72829 Elevated white blood cell count, unspecified: Secondary | ICD-10-CM | POA: Diagnosis not present

## 2021-08-21 ENCOUNTER — Inpatient Hospital Stay: Admission: RE | Admit: 2021-08-21 | Payer: Medicare Other | Source: Ambulatory Visit

## 2021-09-01 ENCOUNTER — Ambulatory Visit
Admission: RE | Admit: 2021-09-01 | Discharge: 2021-09-01 | Disposition: A | Discharge status: Home / Self Care | Payer: Medicare Other | Source: Ambulatory Visit | Attending: Internal Medicine | Admitting: Internal Medicine

## 2021-09-01 ENCOUNTER — Other Ambulatory Visit: Payer: Self-pay

## 2021-09-01 ENCOUNTER — Other Ambulatory Visit: Payer: Medicare Other

## 2021-09-01 DIAGNOSIS — I739 Peripheral vascular disease, unspecified: Secondary | ICD-10-CM | POA: Diagnosis not present

## 2021-09-01 DIAGNOSIS — R519 Headache, unspecified: Secondary | ICD-10-CM | POA: Diagnosis not present

## 2021-09-01 MED ORDER — GADOBENATE DIMEGLUMINE 529 MG/ML IV SOLN
12.0000 mL | Freq: Once | INTRAVENOUS | Status: AC | PRN
  Administered 2021-09-01: 12 mL via INTRAVENOUS

## 2021-09-13 DIAGNOSIS — R519 Headache, unspecified: Secondary | ICD-10-CM | POA: Diagnosis not present

## 2021-09-13 DIAGNOSIS — M542 Cervicalgia: Secondary | ICD-10-CM | POA: Diagnosis not present

## 2021-09-20 DIAGNOSIS — R519 Headache, unspecified: Secondary | ICD-10-CM | POA: Diagnosis not present

## 2021-09-20 DIAGNOSIS — M542 Cervicalgia: Secondary | ICD-10-CM | POA: Diagnosis not present

## 2021-09-21 DIAGNOSIS — M810 Age-related osteoporosis without current pathological fracture: Secondary | ICD-10-CM | POA: Diagnosis not present

## 2021-09-21 DIAGNOSIS — K581 Irritable bowel syndrome with constipation: Secondary | ICD-10-CM | POA: Diagnosis not present

## 2021-09-21 DIAGNOSIS — Z0000 Encounter for general adult medical examination without abnormal findings: Secondary | ICD-10-CM | POA: Diagnosis not present

## 2021-09-21 DIAGNOSIS — Z1389 Encounter for screening for other disorder: Secondary | ICD-10-CM | POA: Diagnosis not present

## 2021-09-21 DIAGNOSIS — R739 Hyperglycemia, unspecified: Secondary | ICD-10-CM | POA: Diagnosis not present

## 2021-09-21 DIAGNOSIS — I10 Essential (primary) hypertension: Secondary | ICD-10-CM | POA: Diagnosis not present

## 2021-09-21 DIAGNOSIS — E7800 Pure hypercholesterolemia, unspecified: Secondary | ICD-10-CM | POA: Diagnosis not present

## 2021-10-10 DIAGNOSIS — R42 Dizziness and giddiness: Secondary | ICD-10-CM | POA: Diagnosis not present

## 2021-11-27 ENCOUNTER — Emergency Department (HOSPITAL_COMMUNITY): Payer: Medicare Other

## 2021-11-27 ENCOUNTER — Emergency Department (HOSPITAL_BASED_OUTPATIENT_CLINIC_OR_DEPARTMENT_OTHER): Payer: Medicare Other

## 2021-11-27 ENCOUNTER — Encounter (HOSPITAL_BASED_OUTPATIENT_CLINIC_OR_DEPARTMENT_OTHER): Payer: Self-pay | Admitting: Emergency Medicine

## 2021-11-27 ENCOUNTER — Emergency Department (HOSPITAL_BASED_OUTPATIENT_CLINIC_OR_DEPARTMENT_OTHER)
Admission: EM | Admit: 2021-11-27 | Discharge: 2021-11-27 | Disposition: A | Discharge status: Home / Self Care | Payer: Medicare Other | Attending: Emergency Medicine | Admitting: Emergency Medicine

## 2021-11-27 ENCOUNTER — Other Ambulatory Visit: Payer: Self-pay

## 2021-11-27 DIAGNOSIS — I773 Arterial fibromuscular dysplasia: Secondary | ICD-10-CM | POA: Diagnosis not present

## 2021-11-27 DIAGNOSIS — I10 Essential (primary) hypertension: Secondary | ICD-10-CM | POA: Insufficient documentation

## 2021-11-27 DIAGNOSIS — I6782 Cerebral ischemia: Secondary | ICD-10-CM | POA: Insufficient documentation

## 2021-11-27 DIAGNOSIS — I672 Cerebral atherosclerosis: Secondary | ICD-10-CM | POA: Diagnosis not present

## 2021-11-27 DIAGNOSIS — Z79899 Other long term (current) drug therapy: Secondary | ICD-10-CM | POA: Insufficient documentation

## 2021-11-27 DIAGNOSIS — I6523 Occlusion and stenosis of bilateral carotid arteries: Secondary | ICD-10-CM | POA: Diagnosis not present

## 2021-11-27 DIAGNOSIS — R42 Dizziness and giddiness: Secondary | ICD-10-CM | POA: Diagnosis not present

## 2021-11-27 DIAGNOSIS — M542 Cervicalgia: Secondary | ICD-10-CM | POA: Insufficient documentation

## 2021-11-27 LAB — CBC WITH DIFFERENTIAL/PLATELET
Abs Immature Granulocytes: 0.02 10*3/uL (ref 0.00–0.07)
Basophils Absolute: 0 10*3/uL (ref 0.0–0.1)
Basophils Relative: 1 %
Eosinophils Absolute: 0.3 10*3/uL (ref 0.0–0.5)
Eosinophils Relative: 4 %
HCT: 40.5 % (ref 36.0–46.0)
Hemoglobin: 13.5 g/dL (ref 12.0–15.0)
Immature Granulocytes: 0 %
Lymphocytes Relative: 22 %
Lymphs Abs: 1.4 10*3/uL (ref 0.7–4.0)
MCH: 29.9 pg (ref 26.0–34.0)
MCHC: 33.3 g/dL (ref 30.0–36.0)
MCV: 89.8 fL (ref 80.0–100.0)
Monocytes Absolute: 0.7 10*3/uL (ref 0.1–1.0)
Monocytes Relative: 11 %
Neutro Abs: 4 10*3/uL (ref 1.7–7.7)
Neutrophils Relative %: 62 %
Platelets: 265 10*3/uL (ref 150–400)
RBC: 4.51 MIL/uL (ref 3.87–5.11)
RDW: 12.5 % (ref 11.5–15.5)
WBC: 6.4 10*3/uL (ref 4.0–10.5)
nRBC: 0 % (ref 0.0–0.2)

## 2021-11-27 LAB — BASIC METABOLIC PANEL
Anion gap: 4 — ABNORMAL LOW (ref 5–15)
BUN: 18 mg/dL (ref 8–23)
CO2: 28 mmol/L (ref 22–32)
Calcium: 9.3 mg/dL (ref 8.9–10.3)
Chloride: 108 mmol/L (ref 98–111)
Creatinine, Ser: 0.9 mg/dL (ref 0.44–1.00)
GFR, Estimated: >60 mL/min (ref >60)
Glucose, Bld: 132 mg/dL — ABNORMAL HIGH (ref 70–99)
Potassium: 4.1 mmol/L (ref 3.5–5.1)
Sodium: 140 mmol/L (ref 135–145)

## 2021-11-27 LAB — CBG MONITORING, ED: Glucose-Capillary: 131 mg/dL — ABNORMAL HIGH (ref 70–99)

## 2021-11-27 MED ORDER — DIAZEPAM 5 MG/ML IJ SOLN
2.5000 mg | Freq: Once | INTRAMUSCULAR | Status: AC
  Administered 2021-11-27: 2.5 mg via INTRAVENOUS
  Filled 2021-11-27: qty 2

## 2021-11-27 MED ORDER — SODIUM CHLORIDE 0.9 % IV BOLUS
1000.0000 mL | Freq: Once | INTRAVENOUS | Status: AC
Start: 2021-11-27 — End: 2021-11-27
  Administered 2021-11-27: 1000 mL via INTRAVENOUS

## 2021-11-27 MED ORDER — SODIUM CHLORIDE 0.9 % IV SOLN: INTRAVENOUS | Status: DC

## 2021-11-27 MED ORDER — METOCLOPRAMIDE HCL 5 MG/ML IJ SOLN: 10.0000 mg | Freq: Once | INTRAMUSCULAR | Status: DC

## 2021-11-27 MED ORDER — ACETAMINOPHEN 500 MG PO TABS
1000.0000 mg | ORAL_TABLET | Freq: Once | ORAL | Status: AC
  Administered 2021-11-27: 1000 mg via ORAL
  Filled 2021-11-27: qty 2

## 2021-11-27 MED ORDER — DIPHENHYDRAMINE HCL 50 MG/ML IJ SOLN: 12.5000 mg | Freq: Once | INTRAMUSCULAR | Status: DC

## 2021-11-27 MED ORDER — IOHEXOL 350 MG/ML SOLN
75.0000 mL | Freq: Once | INTRAVENOUS | Status: AC | PRN
  Administered 2021-11-27: 75 mL via INTRAVENOUS

## 2021-11-27 MED ORDER — SODIUM CHLORIDE 0.9 % IV BOLUS: 1000.0000 mL | Freq: Once | INTRAVENOUS | Status: DC

## 2021-11-27 NOTE — ED Provider Notes (Signed)
?[facility] EMERGENCY DEPARTMENT ?Provider Note ? ? ?CSN: [id number] ?Arrival date & time: 11/27/21  0825 ? ?  ? ?History ? ?Chief Complaint  ?Patient presents with  ? Dizziness  ? ? ?Meghan Hall is a 81 y.o. female. ? ?Persistent dizziness since yesterday morning.  History of high cholesterol, hypertension.  Feels an imbalance when walking but still feels slightly dizzy when sitting still.  Denies any nausea or vomiting.  Denies any weakness, numbness, speech changes, vision changes.  Denies any chest pain or shortness of breath.  No history of stroke.  No history of vertigo.  Denies any headaches. ? ?The history is provided by the patient.  ? ?  ? ?Home Medications ?Prior to Admission medications   ?Medication Sig Start Date End Date Taking? Authorizing Provider  ?atorvastatin (LIPITOR) 10 MG tablet Take 10 mg by mouth daily.  11/20/12   [provider]  ?atorvastatin (LIPITOR) 10 MG tablet Take 1 tablet by mouth daily.    [provider]  ?denosumab (PROLIA) 60 MG/ML SOSY injection 60 mg    [provider]  ?lisinopril (PRINIVIL,ZESTRIL) 5 MG tablet Take 5 mg by mouth daily. 09/03/15   [provider]  ?   ? ?Allergies    ?Evista [raloxifene], Ciprocinonide [fluocinolone], and Flagyl [metronidazole]   ? ?Review of Systems   ?Review of Systems ? ?Physical Exam ?Updated Vital Signs ?BP (!) 144/75 (BP Location: Right Arm)   Pulse 72   Temp 98.4 ?F (36.9 ?C) (Oral)   Resp 19   Ht 5' (1.524 m)   Wt 63.5 kg   SpO2 99%   BMI 27.34 kg/m?  ?Physical Exam ?Vitals and nursing note reviewed.  ?Constitutional:   ?   General: She is not in acute distress. ?   Appearance: She is well-developed. She is not ill-appearing.  ?HENT:  ?   Head: Normocephalic and atraumatic.  ?   Nose: Nose normal.  ?   Mouth/Throat:  ?   Mouth: Mucous membranes are moist.  ?Eyes:  ?   Extraocular Movements: Extraocular movements intact.  ?   Conjunctiva/sclera: Conjunctivae normal.  ?   Pupils: Pupils  are equal, round, and reactive to light.  ?Cardiovascular:  ?   Rate and Rhythm: Normal rate and regular rhythm.  ?   Pulses: Normal pulses.  ?   Heart sounds: Normal heart sounds. No murmur heard. ?Pulmonary:  ?   Effort: Pulmonary effort is normal. No respiratory distress.  ?   Breath sounds: Normal breath sounds.  ?Abdominal:  ?   General: Abdomen is flat.  ?   Palpations: Abdomen is soft.  ?   Tenderness: There is no abdominal tenderness.  ?Musculoskeletal:     ?   General: No swelling.  ?   Cervical back: Normal range of motion and neck supple.  ?Skin: ?   General: Skin is warm and dry.  ?   Capillary Refill: Capillary refill takes less than 2 seconds.  ?Neurological:  ?   General: No focal deficit present.  ?   Mental Status: She is alert and oriented to person, place, and time.  ?   Cranial Nerves: No cranial nerve deficit.  ?   Sensory: No sensory deficit.  ?   Motor: No weakness.  ?   Coordination: Coordination normal.  ?   Comments: 5+ out of 5 strength throughout, normal sensation, no drift, normal finger-nose-finger, normal speech, no visual field deficit, to symptomatic to ambulate  ?Psychiatric:     ?  Mood and Affect: Mood normal.  ? ? ?ED Results / Procedures / Treatments   ?Labs ?(all labs ordered are listed, but only abnormal results are displayed) ?Labs Reviewed  ?BASIC METABOLIC PANEL - Abnormal; Notable for the following components:  ?    Result Value  ? Glucose, Bld 132 (*)   ? Anion gap 4 (*)   ? All other components within normal limits  ?CBG MONITORING, ED - Abnormal; Notable for the following components:  ? Glucose-Capillary 131 (*)   ? All other components within normal limits  ?CBC WITH DIFFERENTIAL/PLATELET  ? ? ?EKG ?EKG Interpretation ? ?Date/Time:  Sunday Nov 27 2021 08:36:15 EDT ?Ventricular Rate:  65 ?PR Interval:  124 ?QRS Duration: 91 ?QT Interval:  397 ?QTC Calculation: 413 ?R Axis:   -10 ?Text Interpretation: Sinus rhythm Low voltage, precordial leads Confirmed by Lockie Mola,  Brooklen Runquist ([id number]) on 11/27/2021 8:37:57 AM ? ?Radiology ?CT ANGIO HEAD NECK W WO CM ? ?Result Date: 11/27/2021 ?CLINICAL DATA:  Dizziness, persistent or recurrent with cardiac or vascular cause suspected EXAM: CT ANGIOGRAPHY HEAD AND NECK TECHNIQUE: Multidetector CT imaging of the head and neck was performed using the standard protocol during bolus administration of intravenous contrast. Multiplanar CT image reconstructions and MIPs were obtained to evaluate the vascular anatomy. Carotid stenosis measurements (when applicable) are obtained utilizing NASCET criteria, using the distal internal carotid diameter as the denominator. RADIATION DOSE REDUCTION: This exam was performed according to the departmental dose-optimization program which includes automated exposure control, adjustment of the mA and/or kV according to patient size and/or use of iterative reconstruction technique. CONTRAST:  61mL OMNIPAQUE IOHEXOL 350 MG/ML SOLN COMPARISON:  Brain MRI 09/01/2021 FINDINGS: CT HEAD FINDINGS Brain: No evidence of acute infarction, hemorrhage, hydrocephalus, extra-axial collection or mass lesion/mass effect. Cerebral volume loss in keeping with age. Vascular: See below Skull: Normal. Negative for fracture or focal lesion. Sinuses: Negative Orbits: Bilateral cataract resection Review of the MIP images confirms the above findings CTA NECK FINDINGS Aortic arch: Partial coverage is unremarkable. Three vessel branching. Right carotid system: ICA beading below the skull base. No significant atherosclerosis and no stenosis or ulceration. Left carotid system: Mild calcified plaque in the proximal ICA. ICA beading below the skull base. No ulceration or significant stenosis. Vertebral arteries: Proximal subclavian atherosclerosis without flow reducing stenosis. The codominant vertebral arteries are widely patent to the dura. Distal vertebral beading more noticeable on the right. Negative for dissection or ulceration Skeleton: Ordinary  cervical spine degeneration. No acute or aggressive finding Other neck: No acute finding.  Bilateral cataract resection Upper chest: Negative Review of the MIP images confirms the above findings CTA HEAD FINDINGS Anterior circulation: Mild for age calcification along the carotid siphons. No branch occlusion, beading, or proximal flow limiting stenosis. Negative for aneurysm. Posterior circulation: The vertebral and basilar arteries are smoothly contoured and widely patent. No branch occlusion, beading, or aneurysm. Venous sinuses: Unremarkable Anatomic variants: None Review of the MIP images confirms the above findings IMPRESSION: 1. No emergent finding. No evidence of vertebrobasilar insufficiency. 2. Mild atherosclerosis for age. 3. 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 Electronically Signed   By: Tiburcio Pea M.D.   On: 11/27/2021 10:17   ? ?Procedures ?Procedures  ? ? ?Medications Ordered in ED ?Medications  ?sodium chloride 0.9 % bolus 1,000 mL (1,000 mLs Intravenous New Bag/Given 11/27/21 0914)  ?diazepam (VALIUM) injection 2.5 mg (2.5 mg Intravenous Given 11/27/21 0914)  ?iohexol (OMNIPAQUE) 350 MG/ML injection 75 mL (75 mLs  Intravenous Contrast Given 11/27/21 0932)  ? ? ?ED Course/ Medical Decision Making/ A&P ?  ?                        ?Medical Decision Making ?Amount and/or Complexity of Data Reviewed ?Labs: ordered. ?Radiology: ordered. ? ?Risk ?Prescription drug management. ? ? ?Scarlette ArMary L Milford is here with dizziness.  History of high cholesterol, hypertension.  Differential diagnosis is vertigo versus stroke versus electrolyte abnormality.  She has no chest pain or shortness of breath.  Doubt any cardiac or pulmonary process.  Denies any nausea vomiting or diarrhea.  Normal vitals.  No fever.  EKG per my review and interpretation shows sinus rhythm.  No ischemic changes.  We will check basic labs including CBC and BMP.  We will get a CTA of head and neck.  We will give a  dose IV Valium and IV fluids.   ? ?Per my review and interpretation of labs there is no significant anemia, electrolyte abnormality, kidney injury.  CT of the head and neck overall unremarkable.  Dr. Karene FryLawsing except

## 2021-11-27 NOTE — ED Notes (Signed)
Phone Handoff Report provided to Charge RN Lestine Mount) at [facility] ED ?

## 2021-11-27 NOTE — ED Notes (Signed)
[facility] Transport Team at bedside 

## 2021-11-27 NOTE — ED Notes (Addendum)
Phone call made to MRI at [facility], informed of pt dx and reason for MRI ?

## 2021-11-27 NOTE — ED Notes (Signed)
Client informed to remain NPO, safety measures in place, significant other at bedside, call bell within reach. Cont to be on cardiac monitor with cont POX and int NBP assessments ?

## 2021-11-27 NOTE — ED Notes (Signed)
Report given to Shannon RN with [facility] 

## 2021-11-27 NOTE — ED Notes (Signed)
ED Provider at bedside. 

## 2021-11-27 NOTE — ED Notes (Signed)
Pt from medical center in [place] for MRI. 2 days of dizziness. CT was neg, [facility] also states pt did not pass swallow screen. Pt has no complaints at the moment.  ?

## 2021-11-27 NOTE — Discharge Instructions (Signed)
See your doctor in close follow up. ?

## 2021-11-27 NOTE — ED Provider Notes (Addendum)
?  Physical Exam  ?BP (!) 144/76   Pulse (!) 57   Temp 98.2 ?F (36.8 ?C) (Oral)   Resp 16   Ht 5' (1.524 m)   Wt 63.5 kg   SpO2 100%   BMI 27.34 kg/m?  ? ? ?Patient received from the [facility] from Dr. Ronnald Nian for MRI imaging.  She has had dizziness for the past 2 days.  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 ? ?MRI imaging of the brain was performed which resulted negative for acute stroke. ?IMPRESSION: ?1. No acute intracranial abnormality. ?2. Mild chronic small vessel ischemic disease. ? ?On my evaluation, the patient denies any active dizziness.  She states that she has a posterior headache, no fevers or chills.  She has intact range of motion with negative meningeal signs.  Her neurologic exam is negative with no cranial nerve deficit, no dysmetria, no ataxia, 5 out of 5 strength and intact sensation all 4 extremities.  She endorses an occipital headache that radiates to her forehead.  She also endorses neck pain on the right.  Her CTA on review was negative for dissection or other acute abnormality.  Plan at time of signout to treat the patient's headache with a migraine cocktail and reassess following symptomatic improvement.  Signout given to Dr. Reather Converse at 1600. ? ? ?  ?Regan Lemming, MD ?11/27/21 1802 ? ?

## 2021-11-27 NOTE — ED Notes (Signed)
Pt back from MRI 

## 2021-11-27 NOTE — ED Triage Notes (Signed)
Pt arrives pov, to room in wheelchair, c/o dizziness that started last night. Also c/o "head feels funny", denies HA, denies n/v/d. A/O x 4, bilaterally equal ?

## 2021-11-27 NOTE — ED Notes (Signed)
Up to [facility], denies any dizziness at this time ?

## 2021-11-27 NOTE — ED Notes (Signed)
States she feels much better, no further complaints of dizziness, BEFAST/ VAN remain negative. VSS, GCS 15 ?

## 2021-11-27 NOTE — ED Notes (Signed)
Pt to MRI

## 2021-11-28 ENCOUNTER — Emergency Department (HOSPITAL_COMMUNITY): Payer: Medicare Other

## 2021-11-28 ENCOUNTER — Encounter (HOSPITAL_COMMUNITY): Payer: Self-pay | Admitting: Emergency Medicine

## 2021-11-28 ENCOUNTER — Observation Stay (HOSPITAL_COMMUNITY)
Admission: EM | Admit: 2021-11-28 | Discharge: 2021-11-29 | Disposition: A | Discharge status: Home / Self Care | Payer: Medicare Other | Attending: General Surgery | Admitting: General Surgery

## 2021-11-28 ENCOUNTER — Other Ambulatory Visit: Payer: Self-pay

## 2021-11-28 DIAGNOSIS — S22000A Wedge compression fracture of unspecified thoracic vertebra, initial encounter for closed fracture: Secondary | ICD-10-CM

## 2021-11-28 DIAGNOSIS — M47812 Spondylosis without myelopathy or radiculopathy, cervical region: Secondary | ICD-10-CM | POA: Diagnosis not present

## 2021-11-28 DIAGNOSIS — Z79899 Other long term (current) drug therapy: Secondary | ICD-10-CM | POA: Diagnosis not present

## 2021-11-28 DIAGNOSIS — S22038A Other fracture of third thoracic vertebra, initial encounter for closed fracture: Secondary | ICD-10-CM | POA: Diagnosis not present

## 2021-11-28 DIAGNOSIS — S3993XA Unspecified injury of pelvis, initial encounter: Secondary | ICD-10-CM | POA: Diagnosis not present

## 2021-11-28 DIAGNOSIS — S2220XA Unspecified fracture of sternum, initial encounter for closed fracture: Secondary | ICD-10-CM | POA: Diagnosis not present

## 2021-11-28 DIAGNOSIS — R531 Weakness: Secondary | ICD-10-CM | POA: Diagnosis not present

## 2021-11-28 DIAGNOSIS — R42 Dizziness and giddiness: Secondary | ICD-10-CM | POA: Diagnosis not present

## 2021-11-28 DIAGNOSIS — J9811 Atelectasis: Secondary | ICD-10-CM | POA: Diagnosis not present

## 2021-11-28 DIAGNOSIS — S2221XA Fracture of manubrium, initial encounter for closed fracture: Secondary | ICD-10-CM

## 2021-11-28 DIAGNOSIS — Y92410 Unspecified street and highway as the place of occurrence of the external cause: Secondary | ICD-10-CM | POA: Insufficient documentation

## 2021-11-28 DIAGNOSIS — R072 Precordial pain: Secondary | ICD-10-CM | POA: Diagnosis present

## 2021-11-28 DIAGNOSIS — S2222XA Fracture of body of sternum, initial encounter for closed fracture: Secondary | ICD-10-CM | POA: Diagnosis not present

## 2021-11-28 DIAGNOSIS — S3991XA Unspecified injury of abdomen, initial encounter: Secondary | ICD-10-CM | POA: Diagnosis not present

## 2021-11-28 DIAGNOSIS — K5730 Diverticulosis of large intestine without perforation or abscess without bleeding: Secondary | ICD-10-CM | POA: Diagnosis not present

## 2021-11-28 DIAGNOSIS — R079 Chest pain, unspecified: Secondary | ICD-10-CM | POA: Diagnosis not present

## 2021-11-28 DIAGNOSIS — S22009A Unspecified fracture of unspecified thoracic vertebra, initial encounter for closed fracture: Secondary | ICD-10-CM | POA: Diagnosis present

## 2021-11-28 DIAGNOSIS — I10 Essential (primary) hypertension: Secondary | ICD-10-CM | POA: Diagnosis not present

## 2021-11-28 DIAGNOSIS — S199XXA Unspecified injury of neck, initial encounter: Secondary | ICD-10-CM | POA: Diagnosis not present

## 2021-11-28 DIAGNOSIS — Z041 Encounter for examination and observation following transport accident: Secondary | ICD-10-CM | POA: Diagnosis not present

## 2021-11-28 DIAGNOSIS — S299XXA Unspecified injury of thorax, initial encounter: Secondary | ICD-10-CM | POA: Diagnosis not present

## 2021-11-28 DIAGNOSIS — S0990XA Unspecified injury of head, initial encounter: Secondary | ICD-10-CM | POA: Diagnosis not present

## 2021-11-28 DIAGNOSIS — I700 Atherosclerosis of aorta: Secondary | ICD-10-CM | POA: Diagnosis not present

## 2021-11-28 LAB — CBC WITH DIFFERENTIAL/PLATELET
Abs Immature Granulocytes: 0.06 10*3/uL (ref 0.00–0.07)
Basophils Absolute: 0 10*3/uL (ref 0.0–0.1)
Basophils Relative: 0 %
Eosinophils Absolute: 0.2 10*3/uL (ref 0.0–0.5)
Eosinophils Relative: 2 %
HCT: 39.1 % (ref 36.0–46.0)
Hemoglobin: 13.3 g/dL (ref 12.0–15.0)
Immature Granulocytes: 1 %
Lymphocytes Relative: 14 %
Lymphs Abs: 1.3 10*3/uL (ref 0.7–4.0)
MCH: 31.1 pg (ref 26.0–34.0)
MCHC: 34 g/dL (ref 30.0–36.0)
MCV: 91.6 fL (ref 80.0–100.0)
Monocytes Absolute: 0.7 10*3/uL (ref 0.1–1.0)
Monocytes Relative: 8 %
Neutro Abs: 6.7 10*3/uL (ref 1.7–7.7)
Neutrophils Relative %: 75 %
Platelets: 254 10*3/uL (ref 150–400)
RBC: 4.27 MIL/uL (ref 3.87–5.11)
RDW: 12.5 % (ref 11.5–15.5)
WBC: 8.9 10*3/uL (ref 4.0–10.5)
nRBC: 0 % (ref 0.0–0.2)

## 2021-11-28 LAB — BASIC METABOLIC PANEL
Anion gap: 7 (ref 5–15)
BUN: 14 mg/dL (ref 8–23)
CO2: 28 mmol/L (ref 22–32)
Calcium: 9.4 mg/dL (ref 8.9–10.3)
Chloride: 107 mmol/L (ref 98–111)
Creatinine, Ser: 0.89 mg/dL (ref 0.44–1.00)
GFR, Estimated: >60 mL/min (ref >60)
Glucose, Bld: 116 mg/dL — ABNORMAL HIGH (ref 70–99)
Potassium: 3.9 mmol/L (ref 3.5–5.1)
Sodium: 142 mmol/L (ref 135–145)

## 2021-11-28 LAB — LACTIC ACID, PLASMA: Lactic Acid, Venous: 1.5 mmol/L (ref 0.5–1.9)

## 2021-11-28 LAB — TROPONIN I (HIGH SENSITIVITY): Troponin I (High Sensitivity): 7 ng/L (ref <18)

## 2021-11-28 MED ORDER — OXYCODONE HCL 5 MG PO TABS
5.0000 mg | ORAL_TABLET | Freq: Once | ORAL | Status: AC
  Administered 2021-11-28: 5 mg via ORAL
  Filled 2021-11-28: qty 1

## 2021-11-28 MED ORDER — LACTATED RINGERS IV SOLN: INTRAVENOUS | Status: DC

## 2021-11-28 MED ORDER — OXYCODONE-ACETAMINOPHEN 5-325 MG PO TABS
1.0000 | ORAL_TABLET | Freq: Once | ORAL | Status: AC
  Administered 2021-11-28: 1 via ORAL
  Filled 2021-11-28: qty 1

## 2021-11-28 MED ORDER — FENTANYL CITRATE PF 50 MCG/ML IJ SOSY: 50.0000 ug | PREFILLED_SYRINGE | Freq: Once | INTRAMUSCULAR | Status: DC

## 2021-11-28 MED ORDER — DOCUSATE SODIUM 100 MG PO CAPS
100.0000 mg | ORAL_CAPSULE | Freq: Two times a day (BID) | ORAL | Status: DC
  Administered 2021-11-28: 100 mg via ORAL
  Filled 2021-11-28: qty 1

## 2021-11-28 MED ORDER — ACETAMINOPHEN 500 MG PO TABS
1000.0000 mg | ORAL_TABLET | Freq: Four times a day (QID) | ORAL | Status: DC
  Administered 2021-11-28 – 2021-11-29 (×2): 1000 mg via ORAL
  Filled 2021-11-28 (×2): qty 2

## 2021-11-28 MED ORDER — ONDANSETRON HCL 4 MG/2ML IJ SOLN: 4.0000 mg | Freq: Four times a day (QID) | INTRAMUSCULAR | Status: DC | PRN

## 2021-11-28 MED ORDER — IOHEXOL 350 MG/ML SOLN
100.0000 mL | Freq: Once | INTRAVENOUS | Status: AC | PRN
  Administered 2021-11-28: 100 mL via INTRAVENOUS

## 2021-11-28 MED ORDER — IOHEXOL 300 MG/ML  SOLN
100.0000 mL | Freq: Once | INTRAMUSCULAR | Status: AC | PRN
  Administered 2021-11-28: 100 mL via INTRAVENOUS

## 2021-11-28 MED ORDER — MORPHINE SULFATE (PF) 2 MG/ML IV SOLN: 2.0000 mg | INTRAVENOUS | Status: DC | PRN

## 2021-11-28 MED ORDER — OXYCODONE HCL 5 MG PO TABS
2.5000 mg | ORAL_TABLET | ORAL | Status: DC | PRN
  Administered 2021-11-29 (×2): 5 mg via ORAL
  Filled 2021-11-28 (×2): qty 1

## 2021-11-28 MED ORDER — ONDANSETRON 4 MG PO TBDP: 4.0000 mg | ORAL_TABLET | Freq: Four times a day (QID) | ORAL | Status: DC | PRN

## 2021-11-28 MED ORDER — ENOXAPARIN SODIUM 30 MG/0.3ML IJ SOSY: 30.0000 mg | PREFILLED_SYRINGE | Freq: Two times a day (BID) | INTRAMUSCULAR | Status: DC
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
## 2021-11-28 NOTE — ED Notes (Signed)
Patient transferred to bathroom via WC, ambulated with one assist ?
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
## 2021-11-28 NOTE — ED Notes (Signed)
Pt moved to hallway bed. 
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
## 2021-11-29 ENCOUNTER — Other Ambulatory Visit (HOSPITAL_COMMUNITY): Payer: Self-pay

## 2021-11-29 DIAGNOSIS — S22038A Other fracture of third thoracic vertebra, initial encounter for closed fracture: Secondary | ICD-10-CM | POA: Diagnosis not present

## 2021-11-29 DIAGNOSIS — S2222XA Fracture of body of sternum, initial encounter for closed fracture: Secondary | ICD-10-CM | POA: Diagnosis not present

## 2021-11-29 DIAGNOSIS — S22020A Wedge compression fracture of second thoracic vertebra, initial encounter for closed fracture: Secondary | ICD-10-CM | POA: Diagnosis not present

## 2021-11-29 LAB — CBC
HCT: 37.1 % (ref 36.0–46.0)
Hemoglobin: 12.3 g/dL (ref 12.0–15.0)
MCH: 30.1 pg (ref 26.0–34.0)
MCHC: 33.2 g/dL (ref 30.0–36.0)
MCV: 90.7 fL (ref 80.0–100.0)
Platelets: 236 10*3/uL (ref 150–400)
RBC: 4.09 MIL/uL (ref 3.87–5.11)
RDW: 12.5 % (ref 11.5–15.5)
WBC: 7.4 10*3/uL (ref 4.0–10.5)
nRBC: 0 % (ref 0.0–0.2)

## 2021-11-29 LAB — BASIC METABOLIC PANEL
Anion gap: 9 (ref 5–15)
BUN: 14 mg/dL (ref 8–23)
CO2: 26 mmol/L (ref 22–32)
Calcium: 9 mg/dL (ref 8.9–10.3)
Chloride: 105 mmol/L (ref 98–111)
Creatinine, Ser: 1.07 mg/dL — ABNORMAL HIGH (ref 0.44–1.00)
GFR, Estimated: 52 mL/min — ABNORMAL LOW (ref >60)
Glucose, Bld: 171 mg/dL — ABNORMAL HIGH (ref 70–99)
Potassium: 3.6 mmol/L (ref 3.5–5.1)
Sodium: 140 mmol/L (ref 135–145)

## 2021-11-29 LAB — LACTIC ACID, PLASMA: Lactic Acid, Venous: 1.3 mmol/L (ref 0.5–1.9)

## 2021-11-29 LAB — TROPONIN I (HIGH SENSITIVITY): Troponin I (High Sensitivity): 5 ng/L (ref <18)

## 2021-11-29 MED ORDER — LACTATED RINGERS IV BOLUS
500.0000 mL | Freq: Once | INTRAVENOUS | Status: AC
  Administered 2021-11-29: 500 mL via INTRAVENOUS

## 2021-11-29 MED ORDER — ACETAMINOPHEN 500 MG PO TABS: 1000.0000 mg | ORAL_TABLET | Freq: Four times a day (QID) | ORAL | Status: AC | PRN

## 2021-11-29 MED ORDER — LINACLOTIDE 145 MCG PO CAPS: 145.0000 ug | ORAL_CAPSULE | ORAL | Status: AC

## 2021-11-29 MED ORDER — OXYCODONE HCL 5 MG PO TABS
2.5000 mg | ORAL_TABLET | ORAL | 0 refills | Status: AC | PRN
  Filled 2021-11-29: qty 20, 4d supply, fill #0
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
## 2021-11-29 NOTE — Care Management CC44 (Signed)
Condition Code 44 Documentation Completed ? ?Patient Details  ?Name: Meghan Hall ?MRN: [id number] ?Date of Birth: [birth year] ? ? ?Condition Code 44 given:  Yes ?Patient signature on Condition Code 44 notice:  Yes ?Documentation of 2 MD's agreement:  Yes ?Code 44 added to claim:  Yes ? ? ? ?Glennon Mac, RN ?11/29/2021, 2:44 PM ? ?
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
## 2021-11-29 NOTE — Care Management Obs Status (Signed)
MEDICARE OBSERVATION STATUS NOTIFICATION ? ? ?Patient Details  ?Name: Meghan Hall ?MRN: [id number] ?Date of Birth: [birth year] ? ? ?Medicare Observation Status Notification Given:  Yes ? ? ? ?Glennon Mac, RN ?11/29/2021, 2:44 PM ?
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
## 2021-12-01 DIAGNOSIS — S2220XA Unspecified fracture of sternum, initial encounter for closed fracture: Secondary | ICD-10-CM | POA: Diagnosis not present

## 2021-12-01 DIAGNOSIS — S32029A Unspecified fracture of second lumbar vertebra, initial encounter for closed fracture: Secondary | ICD-10-CM | POA: Diagnosis not present

## 2021-12-01 DIAGNOSIS — S22039A Unspecified fracture of third thoracic vertebra, initial encounter for closed fracture: Secondary | ICD-10-CM | POA: Diagnosis not present

## 2021-12-08 DIAGNOSIS — S2220XD Unspecified fracture of sternum, subsequent encounter for fracture with routine healing: Secondary | ICD-10-CM | POA: Diagnosis not present

## 2021-12-08 DIAGNOSIS — S22039A Unspecified fracture of third thoracic vertebra, initial encounter for closed fracture: Secondary | ICD-10-CM | POA: Diagnosis not present

## 2021-12-08 DIAGNOSIS — M810 Age-related osteoporosis without current pathological fracture: Secondary | ICD-10-CM | POA: Diagnosis not present

## 2021-12-10 ENCOUNTER — Encounter (HOSPITAL_COMMUNITY): Payer: Self-pay | Admitting: Emergency Medicine

## 2021-12-10 ENCOUNTER — Emergency Department (HOSPITAL_COMMUNITY): Payer: Medicare Other

## 2021-12-10 ENCOUNTER — Other Ambulatory Visit: Payer: Self-pay

## 2021-12-10 ENCOUNTER — Emergency Department (HOSPITAL_COMMUNITY)
Admission: EM | Admit: 2021-12-10 | Discharge: 2021-12-10 | Disposition: A | Discharge status: Home / Self Care | Payer: Medicare Other | Attending: Emergency Medicine | Admitting: Emergency Medicine

## 2021-12-10 DIAGNOSIS — X501XXD Overexertion from prolonged static or awkward postures, subsequent encounter: Secondary | ICD-10-CM | POA: Diagnosis not present

## 2021-12-10 DIAGNOSIS — Z87891 Personal history of nicotine dependence: Secondary | ICD-10-CM | POA: Diagnosis not present

## 2021-12-10 DIAGNOSIS — S299XXD Unspecified injury of thorax, subsequent encounter: Secondary | ICD-10-CM | POA: Diagnosis present

## 2021-12-10 DIAGNOSIS — I10 Essential (primary) hypertension: Secondary | ICD-10-CM | POA: Diagnosis not present

## 2021-12-10 DIAGNOSIS — R0789 Other chest pain: Secondary | ICD-10-CM | POA: Diagnosis not present

## 2021-12-10 DIAGNOSIS — R079 Chest pain, unspecified: Secondary | ICD-10-CM | POA: Diagnosis not present

## 2021-12-10 DIAGNOSIS — S2221XD Fracture of manubrium, subsequent encounter for fracture with routine healing: Secondary | ICD-10-CM

## 2021-12-10 DIAGNOSIS — Z79899 Other long term (current) drug therapy: Secondary | ICD-10-CM | POA: Insufficient documentation

## 2021-12-10 DIAGNOSIS — J9811 Atelectasis: Secondary | ICD-10-CM | POA: Diagnosis not present

## 2021-12-10 DIAGNOSIS — S2221XA Fracture of manubrium, initial encounter for closed fracture: Secondary | ICD-10-CM | POA: Diagnosis not present

## 2021-12-10 LAB — CBC
HCT: 42.6 % (ref 36.0–46.0)
Hemoglobin: 14 g/dL (ref 12.0–15.0)
MCH: 30.5 pg (ref 26.0–34.0)
MCHC: 32.9 g/dL (ref 30.0–36.0)
MCV: 92.8 fL (ref 80.0–100.0)
Platelets: 315 10*3/uL (ref 150–400)
RBC: 4.59 MIL/uL (ref 3.87–5.11)
RDW: 12.3 % (ref 11.5–15.5)
WBC: 8.6 10*3/uL (ref 4.0–10.5)
nRBC: 0 % (ref 0.0–0.2)

## 2021-12-10 LAB — BASIC METABOLIC PANEL
Anion gap: 8 (ref 5–15)
BUN: 17 mg/dL (ref 8–23)
CO2: 29 mmol/L (ref 22–32)
Calcium: 10.3 mg/dL (ref 8.9–10.3)
Chloride: 103 mmol/L (ref 98–111)
Creatinine, Ser: 1.01 mg/dL — ABNORMAL HIGH (ref 0.44–1.00)
GFR, Estimated: 56 mL/min — ABNORMAL LOW (ref >60)
Glucose, Bld: 114 mg/dL — ABNORMAL HIGH (ref 70–99)
Potassium: 4.3 mmol/L (ref 3.5–5.1)
Sodium: 140 mmol/L (ref 135–145)

## 2021-12-10 LAB — TROPONIN I (HIGH SENSITIVITY)
Troponin I (High Sensitivity): 5 ng/L (ref <18)
Troponin I (High Sensitivity): 4 ng/L (ref <18)

## 2021-12-10 LAB — BRAIN NATRIURETIC PEPTIDE: B Natriuretic Peptide: 30.1 pg/mL (ref 0.0–100.0)

## 2021-12-10 MED ORDER — METHOCARBAMOL 500 MG PO TABS: 500.0000 mg | ORAL_TABLET | Freq: Two times a day (BID) | ORAL | 0 refills | Status: AC

## 2021-12-10 MED ORDER — ACETAMINOPHEN 325 MG PO TABS
650.0000 mg | ORAL_TABLET | Freq: Once | ORAL | Status: AC
  Administered 2021-12-10: 650 mg via ORAL
  Filled 2021-12-10: qty 2

## 2021-12-10 MED ORDER — KETOROLAC TROMETHAMINE 15 MG/ML IJ SOLN
15.0000 mg | Freq: Once | INTRAMUSCULAR | Status: AC
  Administered 2021-12-10: 15 mg via INTRAVENOUS
  Filled 2021-12-10: qty 1

## 2021-12-10 MED ORDER — LIDOCAINE 5 % EX PTCH: 1.0000 | MEDICATED_PATCH | Freq: Every day | CUTANEOUS | 0 refills | Status: DC | PRN

## 2021-12-10 MED ORDER — LIDOCAINE 5 % EX PTCH
1.0000 | MEDICATED_PATCH | CUTANEOUS | Status: DC
  Administered 2021-12-10: 1 via TRANSDERMAL
  Filled 2021-12-10: qty 1

## 2021-12-10 NOTE — ED Notes (Signed)
Pt transported to xray 
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
## 2021-12-10 NOTE — Discharge Instructions (Signed)
It was a pleasure caring for you today in the emergency department. ° °Please return to the emergency department for any worsening or worrisome symptoms. ° ° °
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
## 2021-12-10 NOTE — ED Notes (Signed)
Pt alert, NAD; family at bedside

## 2021-12-10 NOTE — ED Notes (Signed)
Pt ambulatory to and from restroom with steady gait 

## 2021-12-10 NOTE — ED Notes (Signed)
Pt instructed on use of incentive spirometer and encouraged to use q 1h throughout the day. Pt and daughter verbalized understanding.

## 2021-12-17 ENCOUNTER — Other Ambulatory Visit: Payer: Self-pay

## 2021-12-17 ENCOUNTER — Encounter (HOSPITAL_BASED_OUTPATIENT_CLINIC_OR_DEPARTMENT_OTHER): Payer: Self-pay | Admitting: Emergency Medicine

## 2021-12-17 ENCOUNTER — Emergency Department (HOSPITAL_BASED_OUTPATIENT_CLINIC_OR_DEPARTMENT_OTHER)
Admission: EM | Admit: 2021-12-17 | Discharge: 2021-12-17 | Disposition: A | Discharge status: Home / Self Care | Payer: Medicare Other | Attending: Emergency Medicine | Admitting: Emergency Medicine

## 2021-12-17 DIAGNOSIS — H81399 Other peripheral vertigo, unspecified ear: Secondary | ICD-10-CM | POA: Diagnosis not present

## 2021-12-17 DIAGNOSIS — R42 Dizziness and giddiness: Secondary | ICD-10-CM | POA: Diagnosis not present

## 2021-12-17 DIAGNOSIS — N390 Urinary tract infection, site not specified: Secondary | ICD-10-CM | POA: Diagnosis not present

## 2021-12-17 DIAGNOSIS — R251 Tremor, unspecified: Secondary | ICD-10-CM | POA: Diagnosis not present

## 2021-12-17 LAB — BASIC METABOLIC PANEL
Anion gap: 6 (ref 5–15)
BUN: 19 mg/dL (ref 8–23)
CO2: 29 mmol/L (ref 22–32)
Calcium: 9.8 mg/dL (ref 8.9–10.3)
Chloride: 105 mmol/L (ref 98–111)
Creatinine, Ser: 0.98 mg/dL (ref 0.44–1.00)
GFR, Estimated: 58 mL/min — ABNORMAL LOW (ref >60)
Glucose, Bld: 119 mg/dL — ABNORMAL HIGH (ref 70–99)
Potassium: 4.1 mmol/L (ref 3.5–5.1)
Sodium: 140 mmol/L (ref 135–145)

## 2021-12-17 LAB — CBC
HCT: 39.8 % (ref 36.0–46.0)
Hemoglobin: 13.4 g/dL (ref 12.0–15.0)
MCH: 30.4 pg (ref 26.0–34.0)
MCHC: 33.7 g/dL (ref 30.0–36.0)
MCV: 90.2 fL (ref 80.0–100.0)
Platelets: 328 10*3/uL (ref 150–400)
RBC: 4.41 MIL/uL (ref 3.87–5.11)
RDW: 12.4 % (ref 11.5–15.5)
WBC: 9.4 10*3/uL (ref 4.0–10.5)
nRBC: 0 % (ref 0.0–0.2)

## 2021-12-17 LAB — URINALYSIS, ROUTINE W REFLEX MICROSCOPIC
Bilirubin Urine: NEGATIVE
Glucose, UA: NEGATIVE mg/dL
Hgb urine dipstick: NEGATIVE
Ketones, ur: NEGATIVE mg/dL
Nitrite: NEGATIVE
Protein, ur: NEGATIVE mg/dL
Specific Gravity, Urine: 1.025 (ref 1.005–1.030)
pH: 5.5 (ref 5.0–8.0)

## 2021-12-17 LAB — URINALYSIS, MICROSCOPIC (REFLEX)

## 2021-12-17 LAB — CBG MONITORING, ED: Glucose-Capillary: 124 mg/dL — ABNORMAL HIGH (ref 70–99)

## 2021-12-17 MED ORDER — CEPHALEXIN 250 MG PO CAPS
500.0000 mg | ORAL_CAPSULE | Freq: Once | ORAL | Status: AC
  Administered 2021-12-17: 500 mg via ORAL
  Filled 2021-12-17: qty 2

## 2021-12-17 MED ORDER — CEPHALEXIN 500 MG PO CAPS: 500.0000 mg | ORAL_CAPSULE | Freq: Three times a day (TID) | ORAL | 0 refills | Status: AC

## 2021-12-17 NOTE — Discharge Instructions (Signed)
It was our pleasure to provide your ER care today - we hope that you feel better.  Your lab tests show a possible urine infection - drink plenty of fluids/stay well hydrated, take antibiotic as prescribed.   Follow up closely with your doctor in the coming week.  Return to ER if worse, new symptoms, fevers, new/severe pain, fainting, chest pain, trouble breathing, or other concern.
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
## 2021-12-17 NOTE — ED Notes (Signed)
ED Provider at bedside. 

## 2021-12-17 NOTE — ED Notes (Signed)
Patient denies any blurried vision.Denies any headaches. States that she gets dizzy with standing

## 2021-12-17 NOTE — ED Triage Notes (Signed)
Pt reports dizziness since Monday, worsening each day; sts head feels as light as a balloon; denies NV

## 2021-12-19 DIAGNOSIS — R42 Dizziness and giddiness: Secondary | ICD-10-CM | POA: Diagnosis not present

## 2021-12-21 DIAGNOSIS — S22029A Unspecified fracture of second thoracic vertebra, initial encounter for closed fracture: Secondary | ICD-10-CM | POA: Diagnosis not present

## 2021-12-27 ENCOUNTER — Ambulatory Visit
Admission: RE | Admit: 2021-12-27 | Discharge: 2021-12-27 | Disposition: A | Discharge status: Home / Self Care | Payer: Medicare Other | Source: Ambulatory Visit | Attending: Internal Medicine | Admitting: Internal Medicine

## 2021-12-27 ENCOUNTER — Other Ambulatory Visit: Payer: Self-pay | Admitting: Internal Medicine

## 2021-12-27 DIAGNOSIS — R1032 Left lower quadrant pain: Secondary | ICD-10-CM

## 2022-01-02 DIAGNOSIS — K5792 Diverticulitis of intestine, part unspecified, without perforation or abscess without bleeding: Secondary | ICD-10-CM | POA: Diagnosis not present

## 2022-01-02 DIAGNOSIS — N3000 Acute cystitis without hematuria: Secondary | ICD-10-CM | POA: Diagnosis not present

## 2022-01-15 DIAGNOSIS — Z792 Long term (current) use of antibiotics: Secondary | ICD-10-CM | POA: Diagnosis not present

## 2022-01-15 DIAGNOSIS — Z87442 Personal history of urinary calculi: Secondary | ICD-10-CM | POA: Diagnosis not present

## 2022-01-15 DIAGNOSIS — I700 Atherosclerosis of aorta: Secondary | ICD-10-CM | POA: Diagnosis not present

## 2022-01-15 DIAGNOSIS — E7800 Pure hypercholesterolemia, unspecified: Secondary | ICD-10-CM | POA: Diagnosis not present

## 2022-01-15 DIAGNOSIS — Z881 Allergy status to other antibiotic agents status: Secondary | ICD-10-CM | POA: Diagnosis not present

## 2022-01-15 DIAGNOSIS — Z79899 Other long term (current) drug therapy: Secondary | ICD-10-CM | POA: Diagnosis not present

## 2022-01-15 DIAGNOSIS — R109 Unspecified abdominal pain: Secondary | ICD-10-CM | POA: Diagnosis not present

## 2022-01-15 DIAGNOSIS — I10 Essential (primary) hypertension: Secondary | ICD-10-CM | POA: Diagnosis not present

## 2022-01-15 DIAGNOSIS — K5792 Diverticulitis of intestine, part unspecified, without perforation or abscess without bleeding: Secondary | ICD-10-CM | POA: Diagnosis not present

## 2022-01-15 DIAGNOSIS — Z888 Allergy status to other drugs, medicaments and biological substances status: Secondary | ICD-10-CM | POA: Diagnosis not present

## 2022-01-15 DIAGNOSIS — K5732 Diverticulitis of large intestine without perforation or abscess without bleeding: Secondary | ICD-10-CM | POA: Diagnosis not present

## 2022-01-16 DIAGNOSIS — I10 Essential (primary) hypertension: Secondary | ICD-10-CM | POA: Diagnosis not present

## 2022-01-16 DIAGNOSIS — E7800 Pure hypercholesterolemia, unspecified: Secondary | ICD-10-CM | POA: Diagnosis not present

## 2022-01-16 DIAGNOSIS — K5792 Diverticulitis of intestine, part unspecified, without perforation or abscess without bleeding: Secondary | ICD-10-CM | POA: Diagnosis not present

## 2022-01-26 DIAGNOSIS — K581 Irritable bowel syndrome with constipation: Secondary | ICD-10-CM | POA: Diagnosis not present

## 2022-01-26 DIAGNOSIS — K5792 Diverticulitis of intestine, part unspecified, without perforation or abscess without bleeding: Secondary | ICD-10-CM | POA: Diagnosis not present

## 2022-01-27 ENCOUNTER — Other Ambulatory Visit: Payer: Self-pay | Admitting: Internal Medicine

## 2022-01-27 DIAGNOSIS — K5792 Diverticulitis of intestine, part unspecified, without perforation or abscess without bleeding: Secondary | ICD-10-CM

## 2022-03-01 ENCOUNTER — Other Ambulatory Visit: Payer: Medicare Other

## 2022-03-24 DIAGNOSIS — M810 Age-related osteoporosis without current pathological fracture: Secondary | ICD-10-CM | POA: Diagnosis not present

## 2022-03-24 DIAGNOSIS — K5730 Diverticulosis of large intestine without perforation or abscess without bleeding: Secondary | ICD-10-CM | POA: Diagnosis not present

## 2022-03-24 DIAGNOSIS — E7800 Pure hypercholesterolemia, unspecified: Secondary | ICD-10-CM | POA: Diagnosis not present

## 2022-03-24 DIAGNOSIS — K5900 Constipation, unspecified: Secondary | ICD-10-CM | POA: Diagnosis not present

## 2022-03-24 DIAGNOSIS — Z8719 Personal history of other diseases of the digestive system: Secondary | ICD-10-CM | POA: Diagnosis not present

## 2022-03-24 DIAGNOSIS — N3281 Overactive bladder: Secondary | ICD-10-CM | POA: Diagnosis not present

## 2022-03-24 DIAGNOSIS — I10 Essential (primary) hypertension: Secondary | ICD-10-CM | POA: Diagnosis not present

## 2022-03-27 DIAGNOSIS — N3281 Overactive bladder: Secondary | ICD-10-CM | POA: Diagnosis not present

## 2022-04-04 ENCOUNTER — Other Ambulatory Visit (HOSPITAL_COMMUNITY): Payer: Self-pay

## 2022-04-13 DIAGNOSIS — M810 Age-related osteoporosis without current pathological fracture: Secondary | ICD-10-CM | POA: Diagnosis not present

## 2022-04-13 DIAGNOSIS — E7800 Pure hypercholesterolemia, unspecified: Secondary | ICD-10-CM | POA: Diagnosis not present

## 2022-04-13 DIAGNOSIS — G8929 Other chronic pain: Secondary | ICD-10-CM | POA: Diagnosis not present

## 2022-04-13 DIAGNOSIS — I10 Essential (primary) hypertension: Secondary | ICD-10-CM | POA: Diagnosis not present

## 2022-04-18 DIAGNOSIS — S0993XA Unspecified injury of face, initial encounter: Secondary | ICD-10-CM | POA: Diagnosis not present

## 2022-04-18 DIAGNOSIS — Z9889 Other specified postprocedural states: Secondary | ICD-10-CM | POA: Diagnosis not present

## 2022-04-18 DIAGNOSIS — S0083XA Contusion of other part of head, initial encounter: Secondary | ICD-10-CM | POA: Diagnosis not present

## 2022-04-18 DIAGNOSIS — W19XXXA Unspecified fall, initial encounter: Secondary | ICD-10-CM | POA: Diagnosis not present

## 2022-04-18 DIAGNOSIS — S0990XA Unspecified injury of head, initial encounter: Secondary | ICD-10-CM | POA: Diagnosis not present

## 2022-04-18 DIAGNOSIS — R42 Dizziness and giddiness: Secondary | ICD-10-CM | POA: Diagnosis not present

## 2022-04-18 DIAGNOSIS — M47812 Spondylosis without myelopathy or radiculopathy, cervical region: Secondary | ICD-10-CM | POA: Diagnosis not present

## 2022-04-18 DIAGNOSIS — S199XXA Unspecified injury of neck, initial encounter: Secondary | ICD-10-CM | POA: Diagnosis not present

## 2022-04-18 DIAGNOSIS — R531 Weakness: Secondary | ICD-10-CM | POA: Diagnosis not present

## 2022-05-03 DIAGNOSIS — N3001 Acute cystitis with hematuria: Secondary | ICD-10-CM | POA: Diagnosis not present

## 2022-05-03 DIAGNOSIS — N390 Urinary tract infection, site not specified: Secondary | ICD-10-CM | POA: Diagnosis not present

## 2022-06-14 DIAGNOSIS — M810 Age-related osteoporosis without current pathological fracture: Secondary | ICD-10-CM | POA: Diagnosis not present

## 2022-06-29 DIAGNOSIS — J069 Acute upper respiratory infection, unspecified: Secondary | ICD-10-CM | POA: Diagnosis not present

## 2022-06-29 DIAGNOSIS — J029 Acute pharyngitis, unspecified: Secondary | ICD-10-CM | POA: Diagnosis not present

## 2022-06-29 DIAGNOSIS — R059 Cough, unspecified: Secondary | ICD-10-CM | POA: Diagnosis not present

## 2022-06-29 DIAGNOSIS — Z20822 Contact with and (suspected) exposure to covid-19: Secondary | ICD-10-CM | POA: Diagnosis not present

## 2022-06-30 DIAGNOSIS — J320 Chronic maxillary sinusitis: Secondary | ICD-10-CM | POA: Diagnosis not present

## 2022-07-03 DIAGNOSIS — R42 Dizziness and giddiness: Secondary | ICD-10-CM | POA: Diagnosis not present

## 2022-07-05 DIAGNOSIS — R42 Dizziness and giddiness: Secondary | ICD-10-CM | POA: Diagnosis not present

## 2022-07-05 DIAGNOSIS — T50905A Adverse effect of unspecified drugs, medicaments and biological substances, initial encounter: Secondary | ICD-10-CM | POA: Diagnosis not present

## 2022-07-05 DIAGNOSIS — R5381 Other malaise: Secondary | ICD-10-CM | POA: Diagnosis not present

## 2022-07-05 DIAGNOSIS — R9431 Abnormal electrocardiogram [ECG] [EKG]: Secondary | ICD-10-CM | POA: Diagnosis not present

## 2022-07-05 DIAGNOSIS — R053 Chronic cough: Secondary | ICD-10-CM | POA: Diagnosis not present

## 2022-07-05 DIAGNOSIS — R059 Cough, unspecified: Secondary | ICD-10-CM | POA: Diagnosis not present

## 2022-09-04 DIAGNOSIS — R531 Weakness: Secondary | ICD-10-CM | POA: Diagnosis not present

## 2022-09-04 DIAGNOSIS — Z1152 Encounter for screening for COVID-19: Secondary | ICD-10-CM | POA: Diagnosis not present

## 2022-09-04 DIAGNOSIS — R112 Nausea with vomiting, unspecified: Secondary | ICD-10-CM | POA: Diagnosis not present

## 2022-09-25 DIAGNOSIS — E782 Mixed hyperlipidemia: Secondary | ICD-10-CM | POA: Diagnosis not present

## 2022-09-25 DIAGNOSIS — Z79899 Other long term (current) drug therapy: Secondary | ICD-10-CM | POA: Diagnosis not present

## 2022-09-25 DIAGNOSIS — I10 Essential (primary) hypertension: Secondary | ICD-10-CM | POA: Diagnosis not present

## 2022-09-25 DIAGNOSIS — K581 Irritable bowel syndrome with constipation: Secondary | ICD-10-CM | POA: Diagnosis not present

## 2022-09-25 DIAGNOSIS — R7303 Prediabetes: Secondary | ICD-10-CM | POA: Diagnosis not present

## 2022-09-25 DIAGNOSIS — M810 Age-related osteoporosis without current pathological fracture: Secondary | ICD-10-CM | POA: Diagnosis not present

## 2022-09-25 DIAGNOSIS — Z0000 Encounter for general adult medical examination without abnormal findings: Secondary | ICD-10-CM | POA: Diagnosis not present

## 2022-09-28 DIAGNOSIS — K529 Noninfective gastroenteritis and colitis, unspecified: Secondary | ICD-10-CM | POA: Diagnosis not present

## 2022-09-28 DIAGNOSIS — M79662 Pain in left lower leg: Secondary | ICD-10-CM | POA: Diagnosis not present

## 2022-12-17 DIAGNOSIS — Z5321 Procedure and treatment not carried out due to patient leaving prior to being seen by health care provider: Secondary | ICD-10-CM | POA: Diagnosis not present

## 2022-12-17 DIAGNOSIS — I10 Essential (primary) hypertension: Secondary | ICD-10-CM | POA: Diagnosis not present

## 2022-12-17 DIAGNOSIS — R519 Headache, unspecified: Secondary | ICD-10-CM | POA: Diagnosis not present

## 2023-01-08 DIAGNOSIS — Z5321 Procedure and treatment not carried out due to patient leaving prior to being seen by health care provider: Secondary | ICD-10-CM | POA: Diagnosis not present

## 2023-01-08 DIAGNOSIS — R109 Unspecified abdominal pain: Secondary | ICD-10-CM | POA: Diagnosis not present

## 2023-01-12 DIAGNOSIS — K529 Noninfective gastroenteritis and colitis, unspecified: Secondary | ICD-10-CM | POA: Diagnosis not present

## 2023-02-20 DIAGNOSIS — H4720 Unspecified optic atrophy: Secondary | ICD-10-CM | POA: Diagnosis not present

## 2023-02-20 DIAGNOSIS — H3322 Serous retinal detachment, left eye: Secondary | ICD-10-CM | POA: Diagnosis not present

## 2023-02-20 DIAGNOSIS — Z961 Presence of intraocular lens: Secondary | ICD-10-CM | POA: Diagnosis not present

## 2023-02-20 DIAGNOSIS — Z0100 Encounter for examination of eyes and vision without abnormal findings: Secondary | ICD-10-CM | POA: Diagnosis not present

## 2023-03-24 DIAGNOSIS — N3000 Acute cystitis without hematuria: Secondary | ICD-10-CM | POA: Diagnosis not present

## 2023-03-24 DIAGNOSIS — I10 Essential (primary) hypertension: Secondary | ICD-10-CM | POA: Diagnosis not present

## 2023-03-24 DIAGNOSIS — R109 Unspecified abdominal pain: Secondary | ICD-10-CM | POA: Diagnosis not present

## 2023-03-24 DIAGNOSIS — K5730 Diverticulosis of large intestine without perforation or abscess without bleeding: Secondary | ICD-10-CM | POA: Diagnosis not present

## 2023-03-28 DIAGNOSIS — M549 Dorsalgia, unspecified: Secondary | ICD-10-CM | POA: Diagnosis not present

## 2023-03-28 DIAGNOSIS — M25551 Pain in right hip: Secondary | ICD-10-CM | POA: Diagnosis not present

## 2023-03-28 DIAGNOSIS — M810 Age-related osteoporosis without current pathological fracture: Secondary | ICD-10-CM | POA: Diagnosis not present

## 2023-03-28 DIAGNOSIS — I10 Essential (primary) hypertension: Secondary | ICD-10-CM | POA: Diagnosis not present

## 2023-04-27 DIAGNOSIS — R0981 Nasal congestion: Secondary | ICD-10-CM | POA: Diagnosis not present

## 2023-04-27 DIAGNOSIS — R051 Acute cough: Secondary | ICD-10-CM | POA: Diagnosis not present

## 2023-04-27 DIAGNOSIS — R519 Headache, unspecified: Secondary | ICD-10-CM | POA: Diagnosis not present

## 2023-04-27 DIAGNOSIS — J069 Acute upper respiratory infection, unspecified: Secondary | ICD-10-CM | POA: Diagnosis not present

## 2023-04-27 DIAGNOSIS — J3489 Other specified disorders of nose and nasal sinuses: Secondary | ICD-10-CM | POA: Diagnosis not present

## 2023-04-27 DIAGNOSIS — R5381 Other malaise: Secondary | ICD-10-CM | POA: Diagnosis not present

## 2023-04-27 DIAGNOSIS — R509 Fever, unspecified: Secondary | ICD-10-CM | POA: Diagnosis not present

## 2023-07-15 DIAGNOSIS — M542 Cervicalgia: Secondary | ICD-10-CM | POA: Diagnosis not present

## 2023-07-15 DIAGNOSIS — R519 Headache, unspecified: Secondary | ICD-10-CM | POA: Diagnosis not present

## 2023-07-15 DIAGNOSIS — I10 Essential (primary) hypertension: Secondary | ICD-10-CM | POA: Diagnosis not present

## 2023-07-29 DIAGNOSIS — M461 Sacroiliitis, not elsewhere classified: Secondary | ICD-10-CM | POA: Diagnosis not present

## 2023-07-29 DIAGNOSIS — M1611 Unilateral primary osteoarthritis, right hip: Secondary | ICD-10-CM | POA: Diagnosis not present

## 2023-07-31 DIAGNOSIS — R159 Full incontinence of feces: Secondary | ICD-10-CM | POA: Diagnosis not present

## 2023-07-31 DIAGNOSIS — M549 Dorsalgia, unspecified: Secondary | ICD-10-CM | POA: Diagnosis not present

## 2023-07-31 DIAGNOSIS — R109 Unspecified abdominal pain: Secondary | ICD-10-CM | POA: Diagnosis not present

## 2023-08-30 DIAGNOSIS — Z5321 Procedure and treatment not carried out due to patient leaving prior to being seen by health care provider: Secondary | ICD-10-CM | POA: Diagnosis not present

## 2023-08-30 DIAGNOSIS — M25551 Pain in right hip: Secondary | ICD-10-CM | POA: Diagnosis not present

## 2023-08-30 DIAGNOSIS — M25552 Pain in left hip: Secondary | ICD-10-CM | POA: Diagnosis not present

## 2023-09-11 DIAGNOSIS — R143 Flatulence: Secondary | ICD-10-CM | POA: Diagnosis not present

## 2023-09-11 DIAGNOSIS — R197 Diarrhea, unspecified: Secondary | ICD-10-CM | POA: Diagnosis not present

## 2023-09-26 DIAGNOSIS — M810 Age-related osteoporosis without current pathological fracture: Secondary | ICD-10-CM | POA: Diagnosis not present

## 2023-09-26 DIAGNOSIS — M25561 Pain in right knee: Secondary | ICD-10-CM | POA: Diagnosis not present

## 2023-09-26 DIAGNOSIS — Z79899 Other long term (current) drug therapy: Secondary | ICD-10-CM | POA: Diagnosis not present

## 2023-09-26 DIAGNOSIS — E782 Mixed hyperlipidemia: Secondary | ICD-10-CM | POA: Diagnosis not present

## 2023-09-26 DIAGNOSIS — Z1331 Encounter for screening for depression: Secondary | ICD-10-CM | POA: Diagnosis not present

## 2023-09-26 DIAGNOSIS — I10 Essential (primary) hypertension: Secondary | ICD-10-CM | POA: Diagnosis not present

## 2023-09-26 DIAGNOSIS — Z0000 Encounter for general adult medical examination without abnormal findings: Secondary | ICD-10-CM | POA: Diagnosis not present

## 2023-09-26 DIAGNOSIS — R4189 Other symptoms and signs involving cognitive functions and awareness: Secondary | ICD-10-CM | POA: Diagnosis not present

## 2023-09-26 DIAGNOSIS — R7303 Prediabetes: Secondary | ICD-10-CM | POA: Diagnosis not present

## 2023-11-30 DIAGNOSIS — I10 Essential (primary) hypertension: Secondary | ICD-10-CM | POA: Diagnosis not present

## 2023-11-30 DIAGNOSIS — F03B0 Unspecified dementia, moderate, without behavioral disturbance, psychotic disturbance, mood disturbance, and anxiety: Secondary | ICD-10-CM | POA: Diagnosis not present

## 2024-02-20 DIAGNOSIS — M25361 Other instability, right knee: Secondary | ICD-10-CM | POA: Diagnosis not present

## 2024-02-20 DIAGNOSIS — F0390 Unspecified dementia without behavioral disturbance: Secondary | ICD-10-CM | POA: Diagnosis not present

## 2024-02-20 DIAGNOSIS — R4181 Age-related cognitive decline: Secondary | ICD-10-CM | POA: Diagnosis not present

## 2024-02-23 DIAGNOSIS — M25551 Pain in right hip: Secondary | ICD-10-CM | POA: Diagnosis not present

## 2024-02-23 DIAGNOSIS — M545 Low back pain, unspecified: Secondary | ICD-10-CM | POA: Diagnosis not present

## 2024-02-23 DIAGNOSIS — M1611 Unilateral primary osteoarthritis, right hip: Secondary | ICD-10-CM | POA: Diagnosis not present

## 2024-03-18 DIAGNOSIS — N3001 Acute cystitis with hematuria: Secondary | ICD-10-CM | POA: Diagnosis not present

## 2024-05-10 ENCOUNTER — Other Ambulatory Visit: Payer: Self-pay

## 2024-05-10 ENCOUNTER — Emergency Department (HOSPITAL_BASED_OUTPATIENT_CLINIC_OR_DEPARTMENT_OTHER): Admission: EM | Admit: 2024-05-10 | Discharge: 2024-05-10 | Disposition: A | Discharge status: Home / Self Care

## 2024-05-10 ENCOUNTER — Encounter (HOSPITAL_BASED_OUTPATIENT_CLINIC_OR_DEPARTMENT_OTHER): Payer: Self-pay

## 2024-05-10 ENCOUNTER — Emergency Department (HOSPITAL_BASED_OUTPATIENT_CLINIC_OR_DEPARTMENT_OTHER)

## 2024-05-10 DIAGNOSIS — M25559 Pain in unspecified hip: Secondary | ICD-10-CM | POA: Diagnosis not present

## 2024-05-10 DIAGNOSIS — W19XXXA Unspecified fall, initial encounter: Secondary | ICD-10-CM | POA: Diagnosis not present

## 2024-05-10 DIAGNOSIS — M25552 Pain in left hip: Secondary | ICD-10-CM | POA: Insufficient documentation

## 2024-05-10 DIAGNOSIS — M545 Low back pain, unspecified: Secondary | ICD-10-CM | POA: Diagnosis not present

## 2024-05-10 MED ORDER — LIDOCAINE 5 % EX PTCH
1.0000 | MEDICATED_PATCH | Freq: Once | CUTANEOUS | Status: DC
  Administered 2024-05-10: 1 via TRANSDERMAL
  Filled 2024-05-10: qty 1

## 2024-05-10 MED ORDER — LIDOCAINE 5 % EX PTCH: 1.0000 | MEDICATED_PATCH | CUTANEOUS | 0 refills | Status: AC

## 2024-05-10 NOTE — Discharge Instructions (Signed)
 You may take Tylenol  every 6 hours for baseline pain control.  We are prescribing a lidocaine  patch to further use.  Please follow-up with your primary doctor.  Return for fevers, chills, worsening pain, inability to ambulate, weakness in your legs, numbness in your genital area, bowel or bladder incontinence or any new or worsening symptoms that are concerning to you.

## 2024-05-10 NOTE — ED Triage Notes (Addendum)
 Pt states that she fell yesterday onto both knees onto a wood floor. No loss of consciousness. Was able to get up on her own. Left hip/butt area now having pain. Not on thinners. Not on any medications.

## 2024-05-10 NOTE — ED Provider Notes (Signed)
 [facility] Provider Note   CSN: [id number] Arrival date & time: 05/10/24  1636     Patient presents with: Meghan Hall is a 83 y.o. female.   This is an 83 year old female presenting emergency department for evaluation after a fall.  Reports that she fell onto her knees from a crouched position last night when trying to get something out from underneath her bed.  She did not fall forward or hit her head, she notes that she was relatively okay last night, but awoken this morning with significant pain to her left hip and low back.  No numbness tingling changes in sensation.  No bowel or bladder incontinence.  No saddle anesthesia.  Is able to ambulate, but that does make the pain worse.  Took some Tylenol  with improvement of her pain.   Fall       Prior to Admission medications   Medication Sig Start Date End Date Taking? Authorizing Provider  acetaminophen  (TYLENOL ) 500 MG tablet Take 2 tablets (1,000 mg total) by mouth every 6 (six) hours as needed for mild pain or fever. 11/29/21   Vicci Burnard SAUNDERS, PA-C  atorvastatin (LIPITOR) 10 MG tablet Take 10 mg by mouth daily.    [provider]  cephALEXin  (KEFLEX ) 500 MG capsule Take 1 capsule (500 mg total) by mouth 3 (three) times daily. 12/17/21   Steinl, Kevin, MD  lidocaine  (LIDODERM ) 5 % Place 1 patch onto the skin daily as needed. Remove & Discard patch within 12 hours or as directed by MD 12/10/21   Elnor Savant A, DO  linaclotide  (LINZESS ) 145 MCG CAPS capsule Take 1 capsule (145 mcg total) by mouth every other day. 11/29/21   Vicci Burnard SAUNDERS, PA-C  lisinopril (PRINIVIL,ZESTRIL) 5 MG tablet Take 5 mg by mouth daily as needed (for blood pressure over 150). 09/03/15   [provider]  meloxicam (MOBIC) 7.5 MG tablet Take 7.5 mg by mouth daily as needed for pain. 11/05/21   [provider]  omeprazole (PRILOSEC) 20 MG capsule Take 20 mg by mouth at bedtime.     [provider]  oxyCODONE  (OXY IR/ROXICODONE ) 5 MG immediate release tablet Take 0.5-1 tablets (2.5-5 mg total) by mouth every 4 (four) hours as needed for moderate pain or severe pain (2.5mg  for moderate pain, 5mg  for severe pain). 11/29/21   Vicci Burnard SAUNDERS, PA-C  prednisoLONE acetate (PRED FORTE) 1 % ophthalmic suspension Place 1 drop into both eyes in the morning and at bedtime. 06/06/21   [provider]  simethicone (MYLICON) 80 MG chewable tablet Chew 80 mg by mouth every other day.    [provider]    Allergies: Evista [raloxifene], Ciprocinonide [fluocinolone], and Flagyl [metronidazole]    Review of Systems  Updated Vital Signs BP (!) 166/94 (BP Location: Right Arm)   Pulse 85   Temp 98 F (36.7 C)   Resp 18   Ht 5' (1.524 m)   Wt 62.6 kg   SpO2 98%   BMI 26.95 kg/m   Physical Exam Vitals and nursing note reviewed.  Constitutional:      General: She is not in acute distress.    Appearance: She is not toxic-appearing.  HENT:     Head: Normocephalic.     Nose: Nose normal.     Mouth/Throat:     Mouth: Mucous membranes are moist.  Cardiovascular:     Rate and Rhythm: Normal rate and regular  rhythm.  Pulmonary:     Effort: Pulmonary effort is normal.     Breath sounds: Normal breath sounds.  Abdominal:     General: Abdomen is flat. There is no distension.     Tenderness: There is no abdominal tenderness. There is no guarding or rebound.  Musculoskeletal:     Right lower leg: No edema.     Left lower leg: No edema.     Comments: No midline spinal tenderness.  Some tenderness along the posterior iliac crest.  5 out of 5 plantarflexion dorsiflexion.  Able lift legs off bed.  Straight leg test is negative.  No pain with internal or external rotation of the hip.  Skin:    General: Skin is warm and dry.     Capillary Refill: Capillary refill takes less than 2 seconds.  Neurological:     Mental Status: She is alert and oriented to person,  place, and time.  Psychiatric:        Mood and Affect: Mood normal.        Behavior: Behavior normal.     (all labs ordered are listed, but only abnormal results are displayed) Labs Reviewed - No data to display  EKG: None  Radiology: DG Pelvis 1-2 Views Result Date: 05/10/2024 CLINICAL DATA:  Pain after fall. EXAM: PELVIS - 1-2 VIEW COMPARISON:  Radiograph 03/19/2020 FINDINGS: The cortical margins of the bony pelvis are intact. No fracture. Pubic symphysis and sacroiliac joints are congruent, minor degenerative change. Both femoral heads are well-seated in the respective acetabula. IMPRESSION: No pelvic fracture. Electronically Signed   By: Andrea Gasman M.D.   On: 05/10/2024 17:42     Procedures   Medications Ordered in the ED  lidocaine  (LIDODERM ) 5 % 1 patch (has no administration in time range)                                    Medical Decision Making This is an 83 year old female presenting emergency department with left hip/low back pain after minor fall yesterday.  She did not hit her head, no LOC not on blood thinners.  No indication for CT scans based on [place] head rules.  She is neurovascularly intact in her lower extremities with no midline tenderness.  Ambulatory, although with some pain.  Pelvic x-ray negative for fracture.  Discussed supportive care and PCP follow-up.  Given lidocaine  patch in the emergency department.  Will also give prescription for discharge.  Stable to go at this point.  Amount and/or Complexity of Data Reviewed Radiology: ordered.  Risk Prescription drug management.       Final diagnoses:  None    ED Discharge Orders     None          Neysa Caron PARAS, DO 05/10/24 1806

## 2024-05-21 DIAGNOSIS — S298XXA Other specified injuries of thorax, initial encounter: Secondary | ICD-10-CM | POA: Diagnosis not present

## 2024-05-21 DIAGNOSIS — S2232XA Fracture of one rib, left side, initial encounter for closed fracture: Secondary | ICD-10-CM | POA: Diagnosis not present

## 2024-06-01 DIAGNOSIS — Z79899 Other long term (current) drug therapy: Secondary | ICD-10-CM | POA: Diagnosis not present

## 2024-06-01 DIAGNOSIS — I10 Essential (primary) hypertension: Secondary | ICD-10-CM | POA: Diagnosis not present

## 2024-06-01 DIAGNOSIS — R519 Headache, unspecified: Secondary | ICD-10-CM | POA: Diagnosis not present

## 2024-06-01 DIAGNOSIS — M79605 Pain in left leg: Secondary | ICD-10-CM | POA: Diagnosis not present

## 2024-06-01 DIAGNOSIS — M79602 Pain in left arm: Secondary | ICD-10-CM | POA: Diagnosis not present

## 2024-06-01 DIAGNOSIS — Z87442 Personal history of urinary calculi: Secondary | ICD-10-CM | POA: Diagnosis not present

## 2024-06-01 DIAGNOSIS — R079 Chest pain, unspecified: Secondary | ICD-10-CM | POA: Diagnosis not present

## 2024-06-01 DIAGNOSIS — Z792 Long term (current) use of antibiotics: Secondary | ICD-10-CM | POA: Diagnosis not present

## 2024-06-02 DIAGNOSIS — Z79899 Other long term (current) drug therapy: Secondary | ICD-10-CM | POA: Diagnosis not present

## 2024-06-02 DIAGNOSIS — I499 Cardiac arrhythmia, unspecified: Secondary | ICD-10-CM | POA: Diagnosis not present

## 2024-06-02 DIAGNOSIS — Z87898 Personal history of other specified conditions: Secondary | ICD-10-CM | POA: Diagnosis not present

## 2024-06-02 DIAGNOSIS — R52 Pain, unspecified: Secondary | ICD-10-CM | POA: Diagnosis not present

## 2024-06-02 DIAGNOSIS — R531 Weakness: Secondary | ICD-10-CM | POA: Diagnosis not present

## 2024-06-02 DIAGNOSIS — N390 Urinary tract infection, site not specified: Secondary | ICD-10-CM | POA: Diagnosis not present

## 2024-06-02 DIAGNOSIS — R410 Disorientation, unspecified: Secondary | ICD-10-CM | POA: Diagnosis not present

## 2024-06-02 DIAGNOSIS — I959 Hypotension, unspecified: Secondary | ICD-10-CM | POA: Diagnosis not present

## 2024-06-18 DIAGNOSIS — T671XXA Heat syncope, initial encounter: Secondary | ICD-10-CM | POA: Diagnosis not present

## 2024-06-18 DIAGNOSIS — R531 Weakness: Secondary | ICD-10-CM | POA: Diagnosis not present

## 2024-06-18 DIAGNOSIS — Z79899 Other long term (current) drug therapy: Secondary | ICD-10-CM | POA: Diagnosis not present

## 2024-06-18 DIAGNOSIS — R509 Fever, unspecified: Secondary | ICD-10-CM | POA: Diagnosis not present

## 2024-06-18 DIAGNOSIS — R410 Disorientation, unspecified: Secondary | ICD-10-CM | POA: Diagnosis not present

## 2024-06-18 DIAGNOSIS — R059 Cough, unspecified: Secondary | ICD-10-CM | POA: Diagnosis not present

## 2024-06-18 DIAGNOSIS — N390 Urinary tract infection, site not specified: Secondary | ICD-10-CM | POA: Diagnosis not present

## 2024-06-25 DIAGNOSIS — S8265XA Nondisplaced fracture of lateral malleolus of left fibula, initial encounter for closed fracture: Secondary | ICD-10-CM | POA: Diagnosis not present

## 2024-06-25 DIAGNOSIS — Z79899 Other long term (current) drug therapy: Secondary | ICD-10-CM | POA: Diagnosis not present
